# Patient Record
Sex: Male | Born: 1935 | Race: White | Hispanic: No | Marital: Married | Smoking: Never smoker
Health system: Southern US, Community
[De-identification: ages and names within clinical notes are randomized; demographics above are authoritative.]

## PROBLEM LIST (undated history)

## (undated) DIAGNOSIS — B019 Varicella without complication: Secondary | ICD-10-CM

## (undated) DIAGNOSIS — E785 Hyperlipidemia, unspecified: Secondary | ICD-10-CM

## (undated) DIAGNOSIS — E119 Type 2 diabetes mellitus without complications: Secondary | ICD-10-CM

## (undated) DIAGNOSIS — I10 Essential (primary) hypertension: Secondary | ICD-10-CM

## (undated) DIAGNOSIS — K259 Gastric ulcer, unspecified as acute or chronic, without hemorrhage or perforation: Secondary | ICD-10-CM

## (undated) DIAGNOSIS — K5792 Diverticulitis of intestine, part unspecified, without perforation or abscess without bleeding: Secondary | ICD-10-CM

## (undated) HISTORY — DX: Essential (primary) hypertension: I10

## (undated) HISTORY — DX: Type 2 diabetes mellitus without complications: E11.9

## (undated) HISTORY — DX: Varicella without complication: B01.9

## (undated) HISTORY — PX: REPAIR OF PERFORATED ULCER: SHX6065

## (undated) HISTORY — DX: Gastric ulcer, unspecified as acute or chronic, without hemorrhage or perforation: K25.9

## (undated) HISTORY — PX: TONSILLECTOMY: SUR1361

## (undated) HISTORY — DX: Diverticulitis of intestine, part unspecified, without perforation or abscess without bleeding: K57.92

## (undated) HISTORY — DX: Hyperlipidemia, unspecified: E78.5

---

## 2016-12-13 ENCOUNTER — Telehealth: Payer: Self-pay | Admitting: Hematology

## 2016-12-13 NOTE — Telephone Encounter (Signed)
Spoke to the pt's daughter to schedule an appt. Pt is coming from [place]. Records are under the media tab. Aware to have her father here 30 minutes early.

## 2016-12-21 NOTE — Progress Notes (Signed)
[place]  [phone] Fax:([phone]) [place] Note   Patient Care Team: Patient, No Pcp Per as PCP - General (General Practice) 12/22/2016  CHIEF COMPLAINTS/PURPOSE OF CONSULTATION:  Pancreatic Cancer  Oncology History   Cancer Staging Pancreatic cancer [facility]) Staging form: Exocrine Pancreas, AJCC 8th Edition - Clinical stage from 11/24/2016: Stage IIB (cT2, cN1, cM0) - Signed by Truitt Merle, MD on 12/23/2016       Pancreatic cancer ([place])   11/18/2016 Imaging    CT AP WO Contrast 11/18/16 IMPRESSION 1 Soft tissue masslike density in the region of the uncinate process and third portion of the duodenum. It it loss of fat planes between the soft tissue density, third portion duodenum, superior mesenteric vein and superior mesenteric artery. No downstream pancreatic duct dilation present. Low density lymph nodes measuring 15mm present in the gastrohepatic ligament.  2. Prostate gland enlargement with indentation the bladder base by the prostate.  3. Wall thickening of the stomach may represent changes of partial distention, hypertrophic gastritis or infiltrative disorders of the stomach wall.  4. Nonobstructing upper pole right renal calcification. Nonobstructing inferior pole left renal calcification.  5. Small pericardial effusion.       11/24/2016 Initial Biopsy    Cytopathology 11/24/16 INTERPRETATION A. Pancreas, HEAD, Mass, FNA With Cell Block: Mucinous neoplasm. Adenocarcinoma with mucinous differentiation cannot be excluded      11/24/2016 Initial Diagnosis    Pancreatic cancer ([place])      11/25/2016 Imaging    MRI Abdomen W WO Contrast MRCP 11/25/16  IMPRESSION: 1. Mass in the pancreatic head/uncinate process measuring 3.8 cm suspicious for an adenocarcinoma and causing upstream dilatation of the pancreatic duct and atrophy of the remaining pancreas.  2. The mass does appear to contact the posterior margin aspect of the  superior mesenteric artery and causes either marked narrowing or venous occlusion at the confluence to the portal vein and superior mesenteric vein.  3. No definite evidence of metastatic disease noting motion artifact mildly limits assessment.      12/03/2016 Tumor Marker    Baseline   CA19-9:  87  CEA:  10.6      12/03/2016 -  Chemotherapy    Neoadjuvant Chemotherapy Gemcitabine/Abraxane 3 weeks on and 1 weeks off. Will change to 2 weeks and 1 week off starting wth cycle 2.         HISTORY OF PRESENTING ILLNESS: 12/22/16  Cody Mckay 83 y.o. male is here because of recently diagnosed pancreatic cancer. Pt and his wife relocated from [place] to here 2 days ago, to live with their daughter. He was referred by his oncologist Dr. Vedia Coffer in [place]. He presents to the clinic today accompanied by his wife, daughter and granddaughter.   In the past he was diagnosed with an enlarged prostate, diverticulosis, HTN, and DM. He had a stomach ulcer that was cauterized in 2008. His father had lung cancer from smoking. His son had testicular cancer.   He has long-standing history of diabetes, initially presented with worsening hyperglycemia about 3-4 months ago.  He also reports vague abdominal discomfort, low appetite and weight loss.  He was seen by his endocrinologist, further studies with CT, endoscopy and biopsy showed it was his cancer causing elevated blood glucose. He had trouble eating, low energy and weight loss from 216lb to 150lbs over 3 months as of today. 6 months ago he was much more independent and active. 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 Since diagnosis he can do 50% of taking care of himself with no help. Had port placed on 12/14/16. He stated Gemcitabine and Abraxane 3 weeks on and 1 weeks off starting 12/03/16 and has completed 1 cycle so far.   He started treatment in [place] and moved down to [place] to be closer to his family 2 days  ago. Since starting treatment he has experienced episodes of diarrhea, constipation, nausea, and remains with low appetite and low energy. He takes Glucerna twice daily. The chocolate flavor exacerbated his diarrhea so he has strawberry flavor now. His sugar has been in 110-120s. He takes about 60mg  of potassium a day. He last saw Dr. Lyndel Safe on 12/02/16. He takes Zofran that helps his nausea. He has compazine if needed. He notes to feeling cold lately and thinks it is anemia related. His family plans to find PCP and endocrinologist in town soon. They are willing to try [place].    CURRENT THERAPY: Gemcitabine and Abraxane 3 weeks on and 1 week off starting 12/03/16. Will change to 2 weeks on and 1 week off starting with cycle 2.    MEDICAL HISTORY:  Past Medical History:  Diagnosis Date  . Diabetes mellitus without complication ([place])   . Gastric ulcer   . Hypertension     SURGICAL HISTORY: History reviewed. No pertinent surgical history.  SOCIAL HISTORY: Social History   Socioeconomic History  . Marital status: Married    Spouse name: Not on file  . Number of children: Not on file  . Years of education: Not on file  . Highest education level: Not on file  Social Needs  . Financial resource strain: Not on file  . Food insecurity - worry: Not on file  . Food insecurity - inability: Not on file  . Transportation needs - medical: Not on file  . Transportation needs - non-medical: Not on file  Occupational History  . Not on file  Tobacco Use  . Smoking status: Never Smoker  . Smokeless tobacco: Never Used  Substance and Sexual Activity  . Alcohol use: No    Frequency: Never  . Drug use: No  . Sexual activity: Not on file  Other Topics Concern  . Not on file  Social History Narrative  . Not on file    FAMILY HISTORY: Family History  Problem Relation Age of Onset  . Cancer Father        lung cancer  . Cancer Son        testicular cancer     ALLERGIES:  is allergic to  codeine.  MEDICATIONS:  Current Outpatient Medications  Medication Sig Dispense Refill  . alfuzosin (UROXATRAL) 10 MG 24 hr tablet     . amLODipine (NORVASC) 5 MG tablet     . atorvastatin (LIPITOR) 10 MG tablet     . Calcium-Magnesium-Vitamin D (CALCIUM 500 PO) Take 1 tablet by mouth daily.    . diphenoxylate-atropine (LOMOTIL) 2.5-0.025 MG tablet     . esomeprazole (NEXIUM) 40 MG capsule Take 2 capsules by mouth daily.    . insulin aspart (NOVOLOG) 100 UNIT/ML injection Inject 5 Units into the skin 3 (three) times daily before meals.    Marland Kitchen LANTUS SOLOSTAR 100 UNIT/ML Solostar Pen Inject 15 Units into the skin daily.    Marland Kitchen NIASPAN 750 MG CR tablet     . ondansetron (ZOFRAN) 8 MG tablet     . potassium chloride SA (K-DUR,KLOR-CON) 20 MEQ tablet  Take 60 mEq by mouth daily.     . prochlorperazine (COMPAZINE) 10 MG tablet as needed.    . triamcinolone ointment (KENALOG) 0.1 % as needed.    . lidocaine-prilocaine (EMLA) cream Apply 1 application topically as needed. 30 g 2   No current facility-administered medications for this visit.     REVIEW OF SYSTEMS:   Constitutional: Denies fevers, chills or abnormal night sweats (+) low appetite (+) weight loss (+) fatigue  Eyes: Denies blurriness of vision, double vision or watery eyes Ears, nose, mouth, throat, and face: Denies mucositis or sore throat Respiratory: Denies cough, dyspnea or wheezes Cardiovascular: Denies palpitation, chest discomfort or lower extremity swelling Gastrointestinal:  Denies nausea, heartburn (+) diarrhea, constipation (+) nausea  Skin: Denies abnormal skin rashes Lymphatics: Denies new lymphadenopathy or easy bruising Neurological:Denies numbness, tingling or new weaknesses Behavioral/Psych: Mood is stable, no new changes  All other systems were reviewed with the patient and are negative.  PHYSICAL EXAMINATION: ECOG PERFORMANCE STATUS: 3 - Symptomatic, >50% confined to bed  Vitals:   12/22/16 1431  BP: 115/65   Pulse: 76  Resp: 18  SpO2: 100%   Filed Weights   12/22/16 1431  Weight: 150 lb 6.4 oz (68.2 kg)    GENERAL:alert, no distress and comfortable, elderly Caucasian male sitting in wheelchair SKIN: skin color, texture, turgor are normal, no rashes or significant lesions EYES: normal, conjunctiva are pink and non-injected, sclera clear OROPHARYNX:no exudate, no erythema and lips, buccal mucosa, and tongue normal  NECK: supple, thyroid normal size, non-tender, without nodularity LYMPH:  no palpable lymphadenopathy in the cervical, axillary or inguinal LUNGS: clear to auscultation and percussion with normal breathing effort HEART: regular rate & rhythm and no murmurs and no lower extremity edema ABDOMEN:abdomen soft, non-tender and normal bowel sounds, no organomegaly  Musculoskeletal:no cyanosis of digits and no clubbing  PSYCH: alert & oriented x 3 with fluent speech NEURO: no focal motor/sensory deficits  LABORATORY DATA:  I have reviewed the data as listed CBC Latest Ref Rng & Units 12/22/2016  WBC 4.0 - 10.3 10e3/uL 3.0(L)  Hemoglobin 13.0 - 17.1 g/dL 12.4(L)  Hematocrit 38.4 - 49.9 % 37.4(L)  Platelets 140 - 400 10e3/uL 109(L)    CMP Latest Ref Rng & Units 12/22/2016  Glucose 70 - 140 mg/dl 199(H)  BUN 7.0 - 26.0 mg/dL 11.8  Creatinine 0.7 - 1.3 mg/dL 0.8  Sodium 136 - 145 mEq/L 138  Potassium 3.5 - 5.1 mEq/L 3.3(L)  CO2 22 - 29 mEq/L 26  Calcium 8.4 - 10.4 mg/dL 9.1  Total Protein 6.4 - 8.3 g/dL 6.0(L)  Total Bilirubin 0.20 - 1.20 mg/dL 0.62  Alkaline Phos 40 - 150 U/L 91  AST 5 - 34 U/L 27  ALT 0 - 55 U/L 37     CA19-9 12/03/16: 87  CEA  12/03/16: 10.6   PATHOLOGY  Cytopathology 11/24/16 INTERPRETATION A. Pancreas, HEAD, Mass, FNA With Cell Block: Mucinous neoplasm. Adenocarcinoma with mucinous differentiation cannot be excluded     RADIOGRAPHIC STUDIES: I have personally reviewed the radiological images as listed and agreed with the findings in the  report. No results found.  MRI Abdomen W WO Contrast MRCP 11/25/16  IMPRESSION: 1. Mass in the pancreatic head/uncinate process measuring 3.8 cm suspicious for an adenocarcinoma and causing upstream dilatation of the pancreatic duct and atrophy of the remaining pancreas.  2. The mass does appear to contact the posterior margin aspect of the superior mesenteric artery and causes either marked narrowing or  venous occlusion at the confluence to the portal vein and superior mesenteric vein.  3. No definite evidence of metastatic disease noting motion artifact mildly limits assessment.    CT AP WO Contrast 11/18/16 IMPRESSION 1 Soft tissue masslike density in the region of the uncinate process and third portion of the duodenum. It is loss of fat planes between the soft tissue density, third portion duodenum, superior mesenteric vein and superior mesenteric artery. No downstream pancreatic duct dilation present. Low density lymph nodes measuring 21mm present in the gastrohepatic ligament.  2. Prostate gland enlargement with indentation the bladder base by the prostate.  3. Wall thickening of the stomach may represent changes of partial distention, hypertrophic gastritis or infiltrative disorders of the stomach wall.  4. Nonobstructing upper pole right renal calcification. Nonobstructing inferior pole left renal calcification.  5. Small pericardial effusion.    ASSESSMENT & PLAN:  Cody Mckay is a 83 y.o. male with a history of abnormal EKG, right bundle branch block, bleeding gastric ulcer, Diverticulosis, DM, and HTN.    1. 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  However, his tumor is likely unresectable, or at least a borderline resectable, given the SMA invasion. Given his age, poor performance status and nutritional status, he is a not a candidate for surgery also.   -I will discuss his case in our GI tumor board, to get a surgical opinion. -If it is not possible to resect tumor, the goal of care is to control the disease.  -He has started chemo Gemcitabine and Abraxane (3 weeks on and 1 weeks off) on 12/03/16 with dose reduction. He has moderately tolerated his first cycle well. I will adjust his treatment to 2 weeks on and 1 week off for better tolerance. If he can tolerate, we will continue.  Given his advanced age, if he has a tolerance issue, I will change to single agent gemcitabine. -He had a port placed on 12/14/16, I will call in EMLA cream for him to take before treatments. Will get Chest Xray of port today.  -Baseline CEA 10.6 and Ca19.9 87 -Family would like advanced home care and PT to help. I will send referral.  -I will refer him to [facility].  -F/u in 2 weeks with Lacie or me with cycle 2 of chemo. I will confirm his first cycle chemo dose with Dr. Lyndel Safe.  Given his advanced age and performance status, I would likely use reduced dose gemcitabine and Abraxane. -I strongly encouraged him to participate in chemo class here before cycle 2 chemo  2. DM, HTN -I will refer him to [facility] endocrinology -continue insulin and meds  -We discussed the impact of chemotherapy on his glucose and blood pressure, will monitor closely.  3.  Weight loss and malnutrition -I recommended dietitian consult, he declined for now  -I encouraged him to increase nutrition supplement Glucerna  4.  Fatigue and deconditioning -I will refer him to advanced home care for home PT and OT -Encouraged him to be physically active  5.  Hypokalemia -We will increase potassium to 80 Meq daily for 7 days then back to 66meq daily   6. Goal of care discussion    -We discussed the incurable nature of his cancer  if surgery is not an option, and the overall poor prognosis, especially if he does not have good response to chemotherapy or progress on chemo -The patient understands the goal of care is palliative. -I recommend DNR/DNI, he will think about it   PLAN:  -Labs today  -Prescribed EMLA cream today  -[facility], PT and [facility] referrals  -Chest Xray of Port today  Lab, flush, f/u with me or Lacie and chemo gemcitabine and Abraxane on 12/7 and 12/14   Orders Placed This Encounter  Procedures  . DG Chest 1 View    Standing Status:   Future    Number of Occurrences:   1    Standing Expiration Date:   12/22/2017    Order Specific Question:   Reason for Exam (SYMPTOM  OR DIAGNOSIS REQUIRED)    Answer:   confirm port position    Order Specific Question:   Preferred imaging location?    Answer:   [facility]    Order Specific Question:   Radiology Contrast Protocol - do NOT remove file path    Answer:   [email or web address]  . CBC with Differential    Standing Status:   Standing    Number of Occurrences:   100    Standing Expiration Date:   12/22/2021  . Comprehensive metabolic panel    Standing Status:   Standing    Number of Occurrences:   100    Standing Expiration Date:   12/22/2021  . Ferritin    Standing Status:   Future    Number of Occurrences:   1    Standing Expiration Date:   12/22/2017  . Iron and TIBC    Standing Status:   Future    Number of Occurrences:   1    Standing Expiration Date:   12/22/2017  . CA 19.9    Standing Status:   Standing    Number of Occurrences:   100    Standing Expiration Date:   12/22/2021    All questions were answered. The patient knows to call the clinic with any problems, questions or concerns. I spent 45 minutes counseling the patient face to face. The total time spent in the appointment was 60 minutes and more  than 50% was on counseling.     Truitt Merle, MD 12/22/2016    This document serves as a record of services personally performed by Truitt Merle, MD. It was created on her behalf by Joslyn Devon, a trained medical scribe. The creation of this record is based on the scribe's personal observations and the provider's statements to them.    I have reviewed the above documentation for accuracy and completeness, and I agree with the above.

## 2016-12-22 ENCOUNTER — Ambulatory Visit (HOSPITAL_COMMUNITY)
Admission: RE | Admit: 2016-12-22 | Discharge: 2016-12-22 | Disposition: A | Discharge status: Home / Self Care | Payer: Medicare Other | Source: Ambulatory Visit | Attending: Hematology | Admitting: Hematology

## 2016-12-22 ENCOUNTER — Telehealth: Payer: Self-pay | Admitting: Hematology

## 2016-12-22 ENCOUNTER — Ambulatory Visit (HOSPITAL_BASED_OUTPATIENT_CLINIC_OR_DEPARTMENT_OTHER): Payer: Medicare Other | Admitting: Hematology

## 2016-12-22 ENCOUNTER — Telehealth: Payer: Self-pay | Admitting: *Deleted

## 2016-12-22 ENCOUNTER — Encounter: Payer: Self-pay | Admitting: Hematology

## 2016-12-22 ENCOUNTER — Ambulatory Visit (HOSPITAL_BASED_OUTPATIENT_CLINIC_OR_DEPARTMENT_OTHER): Payer: Medicare Other

## 2016-12-22 DIAGNOSIS — E119 Type 2 diabetes mellitus without complications: Secondary | ICD-10-CM

## 2016-12-22 DIAGNOSIS — C250 Malignant neoplasm of head of pancreas: Secondary | ICD-10-CM | POA: Diagnosis not present

## 2016-12-22 DIAGNOSIS — Z7189 Other specified counseling: Secondary | ICD-10-CM

## 2016-12-22 DIAGNOSIS — E876 Hypokalemia: Secondary | ICD-10-CM | POA: Diagnosis not present

## 2016-12-22 DIAGNOSIS — I10 Essential (primary) hypertension: Secondary | ICD-10-CM | POA: Diagnosis not present

## 2016-12-22 DIAGNOSIS — R5383 Other fatigue: Secondary | ICD-10-CM | POA: Diagnosis not present

## 2016-12-22 DIAGNOSIS — Z801 Family history of malignant neoplasm of trachea, bronchus and lung: Secondary | ICD-10-CM | POA: Diagnosis not present

## 2016-12-22 DIAGNOSIS — R634 Abnormal weight loss: Secondary | ICD-10-CM

## 2016-12-22 DIAGNOSIS — E46 Unspecified protein-calorie malnutrition: Secondary | ICD-10-CM | POA: Diagnosis not present

## 2016-12-22 DIAGNOSIS — Z808 Family history of malignant neoplasm of other organs or systems: Secondary | ICD-10-CM | POA: Diagnosis not present

## 2016-12-22 DIAGNOSIS — C259 Malignant neoplasm of pancreas, unspecified: Secondary | ICD-10-CM | POA: Insufficient documentation

## 2016-12-22 LAB — COMPREHENSIVE METABOLIC PANEL
ALBUMIN: 3 g/dL — AB (ref 3.5–5.0)
ALK PHOS: 91 U/L (ref 40–150)
ALT: 37 U/L (ref 0–55)
ANION GAP: 8 meq/L (ref 3–11)
AST: 27 U/L (ref 5–34)
BILIRUBIN TOTAL: 0.62 mg/dL (ref 0.20–1.20)
BUN: 11.8 mg/dL (ref 7.0–26.0)
CALCIUM: 9.1 mg/dL (ref 8.4–10.4)
CO2: 26 meq/L (ref 22–29)
Chloride: 104 mEq/L (ref 98–109)
Creatinine: 0.8 mg/dL (ref 0.7–1.3)
Glucose: 199 mg/dl — ABNORMAL HIGH (ref 70–140)
Potassium: 3.3 mEq/L — ABNORMAL LOW (ref 3.5–5.1)
Sodium: 138 mEq/L (ref 136–145)
TOTAL PROTEIN: 6 g/dL — AB (ref 6.4–8.3)

## 2016-12-22 LAB — CBC WITH DIFFERENTIAL/PLATELET
BASO%: 0.7 % (ref 0.0–2.0)
Basophils Absolute: 0 10*3/uL (ref 0.0–0.1)
EOS%: 0.7 % (ref 0.0–7.0)
Eosinophils Absolute: 0 10*3/uL (ref 0.0–0.5)
HEMATOCRIT: 37.4 % — AB (ref 38.4–49.9)
HEMOGLOBIN: 12.4 g/dL — AB (ref 13.0–17.1)
LYMPH#: 1.2 10*3/uL (ref 0.9–3.3)
LYMPH%: 39.5 % (ref 14.0–49.0)
MCH: 29.5 pg (ref 27.2–33.4)
MCHC: 33.1 g/dL (ref 32.0–36.0)
MCV: 89.3 fL (ref 79.3–98.0)
MONO#: 0.1 10*3/uL (ref 0.1–0.9)
MONO%: 2.9 % (ref 0.0–14.0)
NEUT#: 1.7 10*3/uL (ref 1.5–6.5)
NEUT%: 56.2 % (ref 39.0–75.0)
Platelets: 109 10*3/uL — ABNORMAL LOW (ref 140–400)
RBC: 4.19 10*6/uL — ABNORMAL LOW (ref 4.20–5.82)
RDW: 13.7 % (ref 11.0–14.6)
WBC: 3 10*3/uL — AB (ref 4.0–10.3)

## 2016-12-22 LAB — TECHNOLOGIST REVIEW

## 2016-12-22 MED ORDER — LIDOCAINE-PRILOCAINE 2.5-2.5 % EX CREA: 1.0000 "application " | TOPICAL_CREAM | CUTANEOUS | 2 refills | Status: AC | PRN

## 2016-12-22 NOTE — Telephone Encounter (Signed)
Scheduled appt per 11/21 los - Gave patient AVS and calender per los.  

## 2016-12-22 NOTE — Progress Notes (Signed)
  Oncology Nurse Navigator Documentation  Navigator Location: [facility] (12/22/16 1516) Referral date to RadOnc/MedOnc: 12/10/16 (12/22/16 1516) )Navigator Encounter Type: Initial MedOnc (12/22/16 1516)   Abnormal Finding Date: 11/18/16 (12/22/16 1516) Confirmed Diagnosis Date: 11/24/16 (12/22/16 1516)                 Treatment Phase: Other(transfer of care from [place]) (12/22/16 1516) Barriers/Navigation Needs: No Questions;No Needs (12/22/16 1516)   Interventions: Psycho-social support (12/22/16 1516)  Met with patient and his wife, daughter and granddaughter.  I introduced my role as GI navigator and provided patient with my contact information. I also provided patient with written information from [facility]. Patient verbalized understanding that he can call with questions or concerns.          Acuity: Level 1 (12/22/16 1516) Acuity Level 1: Initial guidance, education and coordination as needed (12/22/16 1516)       Time Spent with Patient: 30 (12/22/16 1516)

## 2016-12-22 NOTE — Telephone Encounter (Signed)
Dr. Burr Medico notified of K+  3.3 today.  Spoke with daughter Blair Promise, and instructed her to have pt take Kdur  80 meq daily for 1 week, then go back to 60 meq daily as per Dr. Ernestina Penna instructions.  Sheryl voiced understanding.

## 2016-12-23 ENCOUNTER — Encounter: Payer: Self-pay | Admitting: Hematology

## 2016-12-23 DIAGNOSIS — Z7189 Other specified counseling: Secondary | ICD-10-CM | POA: Insufficient documentation

## 2016-12-23 LAB — CANCER ANTIGEN 19-9: CA 19-9: 36 U/mL — ABNORMAL HIGH (ref 0–35)

## 2016-12-23 NOTE — Progress Notes (Signed)
START ON PATHWAY REGIMEN - Pancreatic     A cycle is every 28 days:     Nab-paclitaxel (protein bound)      Gemcitabine   **Always confirm dose/schedule in your pharmacy ordering system**    Patient Characteristics: Adenocarcinoma, Locally Advanced, Anatomically Unresectable, First Line, PS = 0, 1 Histology: Adenocarcinoma Current evidence of distant metastases<= No AJCC T Category: T2 AJCC N Category: N1 AJCC M Category: M0 AJCC 8 Stage Grouping: IIB Line of Therapy: First Line Would you be surprised if this patient died  in the next year<= I would NOT be surprised if this patient died in the next year Intent of Therapy: Non-Curative / Palliative Intent, Discussed with Patient

## 2016-12-24 ENCOUNTER — Other Ambulatory Visit: Payer: Self-pay | Admitting: *Deleted

## 2016-12-24 DIAGNOSIS — C251 Malignant neoplasm of body of pancreas: Secondary | ICD-10-CM

## 2016-12-24 LAB — IRON AND TIBC
%SAT: 14 % — AB (ref 20–55)
IRON: 24 ug/dL — AB (ref 42–163)
TIBC: 166 ug/dL — ABNORMAL LOW (ref 202–409)
UIBC: 143 ug/dL (ref 117–376)

## 2016-12-24 LAB — FERRITIN: FERRITIN: 570 ng/mL — AB (ref 22–316)

## 2016-12-28 ENCOUNTER — Other Ambulatory Visit: Payer: Medicare Other

## 2016-12-28 ENCOUNTER — Encounter: Payer: Self-pay | Admitting: *Deleted

## 2016-12-30 ENCOUNTER — Telehealth: Payer: Self-pay | Admitting: *Deleted

## 2016-12-30 NOTE — Telephone Encounter (Signed)
Cody Mckay with [facility] left a message requesting a verbal order for admission to [facility].  RN left her a message that Dr Burr Medico has ordered the [facility] referral. To call back if has questions

## 2017-01-03 ENCOUNTER — Ambulatory Visit (HOSPITAL_BASED_OUTPATIENT_CLINIC_OR_DEPARTMENT_OTHER): Payer: Medicare Other | Admitting: Medical

## 2017-01-03 ENCOUNTER — Ambulatory Visit (HOSPITAL_BASED_OUTPATIENT_CLINIC_OR_DEPARTMENT_OTHER): Payer: Medicare Other

## 2017-01-03 ENCOUNTER — Other Ambulatory Visit: Payer: Self-pay | Admitting: *Deleted

## 2017-01-03 ENCOUNTER — Ambulatory Visit (HOSPITAL_COMMUNITY)
Admission: RE | Admit: 2017-01-03 | Discharge: 2017-01-03 | Disposition: A | Discharge status: Home / Self Care | Payer: Medicare Other | Source: Ambulatory Visit | Attending: Medical | Admitting: Medical

## 2017-01-03 ENCOUNTER — Other Ambulatory Visit: Payer: Self-pay | Admitting: Medical

## 2017-01-03 ENCOUNTER — Telehealth: Payer: Self-pay | Admitting: *Deleted

## 2017-01-03 VITALS — BP 108/65 | HR 65 | Temp 97.8°F | Resp 18 | Ht 70.5 in | Wt 141.9 lb

## 2017-01-03 DIAGNOSIS — R197 Diarrhea, unspecified: Secondary | ICD-10-CM | POA: Diagnosis not present

## 2017-01-03 DIAGNOSIS — C250 Malignant neoplasm of head of pancreas: Secondary | ICD-10-CM

## 2017-01-03 DIAGNOSIS — R112 Nausea with vomiting, unspecified: Secondary | ICD-10-CM

## 2017-01-03 DIAGNOSIS — Z9181 History of falling: Secondary | ICD-10-CM

## 2017-01-03 DIAGNOSIS — E860 Dehydration: Secondary | ICD-10-CM

## 2017-01-03 DIAGNOSIS — R109 Unspecified abdominal pain: Secondary | ICD-10-CM

## 2017-01-03 DIAGNOSIS — D72823 Leukemoid reaction: Secondary | ICD-10-CM

## 2017-01-03 DIAGNOSIS — E876 Hypokalemia: Secondary | ICD-10-CM

## 2017-01-03 DIAGNOSIS — R531 Weakness: Secondary | ICD-10-CM | POA: Diagnosis not present

## 2017-01-03 DIAGNOSIS — K315 Obstruction of duodenum: Secondary | ICD-10-CM | POA: Diagnosis not present

## 2017-01-03 DIAGNOSIS — R001 Bradycardia, unspecified: Secondary | ICD-10-CM | POA: Diagnosis not present

## 2017-01-03 LAB — URINALYSIS, MICROSCOPIC - CHCC
Bilirubin (Urine): NEGATIVE
Blood: NEGATIVE
Glucose: NEGATIVE mg/dL
KETONES: 5 mg/dL
LEUKOCYTE ESTERASE: NEGATIVE
Nitrite: NEGATIVE
RBC / HPF: NEGATIVE (ref 0–2)
SPECIFIC GRAVITY, URINE: 1.02 (ref 1.003–1.035)
Urobilinogen, UR: 0.2 mg/dL (ref 0.2–1)
pH: 6 (ref 4.6–8.0)

## 2017-01-03 LAB — COMPREHENSIVE METABOLIC PANEL
ALBUMIN: 2.8 g/dL — AB (ref 3.5–5.0)
ALK PHOS: 73 U/L (ref 40–150)
ALT: 14 U/L (ref 0–55)
AST: 15 U/L (ref 5–34)
Anion Gap: 10 mEq/L (ref 3–11)
BUN: 25 mg/dL (ref 7.0–26.0)
CO2: 25 mEq/L (ref 22–29)
Calcium: 9.2 mg/dL (ref 8.4–10.4)
Chloride: 103 mEq/L (ref 98–109)
Creatinine: 1 mg/dL (ref 0.7–1.3)
GLUCOSE: 135 mg/dL (ref 70–140)
POTASSIUM: 2.9 meq/L — AB (ref 3.5–5.1)
SODIUM: 138 meq/L (ref 136–145)
Total Bilirubin: 1.52 mg/dL — ABNORMAL HIGH (ref 0.20–1.20)
Total Protein: 6 g/dL — ABNORMAL LOW (ref 6.4–8.3)

## 2017-01-03 LAB — CBC WITH DIFFERENTIAL/PLATELET
BASO%: 0.3 % (ref 0.0–2.0)
Basophils Absolute: 0 10*3/uL (ref 0.0–0.1)
EOS%: 0 % (ref 0.0–7.0)
Eosinophils Absolute: 0 10*3/uL (ref 0.0–0.5)
HEMATOCRIT: 34.6 % — AB (ref 38.4–49.9)
HEMOGLOBIN: 11.5 g/dL — AB (ref 13.0–17.1)
LYMPH#: 1.5 10*3/uL (ref 0.9–3.3)
LYMPH%: 9.5 % — ABNORMAL LOW (ref 14.0–49.0)
MCH: 29.7 pg (ref 27.2–33.4)
MCHC: 33.1 g/dL (ref 32.0–36.0)
MCV: 89.9 fL (ref 79.3–98.0)
MONO#: 1.8 10*3/uL — AB (ref 0.1–0.9)
MONO%: 11.6 % (ref 0.0–14.0)
NEUT%: 78.6 % — AB (ref 39.0–75.0)
NEUTROS ABS: 12.5 10*3/uL — AB (ref 1.5–6.5)
PLATELETS: 326 10*3/uL (ref 140–400)
RBC: 3.85 10*6/uL — ABNORMAL LOW (ref 4.20–5.82)
RDW: 15.2 % — ABNORMAL HIGH (ref 11.0–14.6)
WBC: 15.9 10*3/uL — ABNORMAL HIGH (ref 4.0–10.3)

## 2017-01-03 MED ORDER — SODIUM CHLORIDE 0.9% FLUSH
10.0000 mL | Freq: Once | INTRAVENOUS | Status: AC
  Administered 2017-01-03: 10 mL via INTRAVENOUS
  Filled 2017-01-03: qty 10

## 2017-01-03 MED ORDER — ONDANSETRON HCL 4 MG/2ML IJ SOLN
8.0000 mg | Freq: Once | INTRAMUSCULAR | Status: AC
  Administered 2017-01-03: 8 mg via INTRAVENOUS

## 2017-01-03 MED ORDER — ONDANSETRON HCL 4 MG/2ML IJ SOLN
INTRAMUSCULAR | Status: AC
  Filled 2017-01-03: qty 4

## 2017-01-03 MED ORDER — SODIUM CHLORIDE 0.9 % IV SOLN: Freq: Once | INTRAVENOUS | Status: DC

## 2017-01-03 MED ORDER — HEPARIN SOD (PORK) LOCK FLUSH 100 UNIT/ML IV SOLN
500.0000 [IU] | Freq: Once | INTRAVENOUS | Status: AC
  Administered 2017-01-03: 500 [IU] via INTRAVENOUS
  Filled 2017-01-03: qty 5

## 2017-01-03 MED ORDER — SODIUM CHLORIDE 0.9 % IV SOLN
Freq: Once | INTRAVENOUS | Status: AC
  Administered 2017-01-03: 12:00:00 via INTRAVENOUS
  Filled 2017-01-03: qty 1000

## 2017-01-03 MED ORDER — SODIUM CHLORIDE 0.9 % IV SOLN
Freq: Once | INTRAVENOUS | Status: AC
  Administered 2017-01-03: 11:00:00 via INTRAVENOUS

## 2017-01-03 NOTE — Telephone Encounter (Signed)
Received call from Cody Mckay stating that pt has not been drinking well & vomited 7 times since Friday-none since Sun AM, weak, fell in bathtub early this am & thinks he is dehydrated.  Informed to come in to see Symptom Management this am.  Scheduling message sent.

## 2017-01-03 NOTE — Progress Notes (Signed)
Urine specimen obtained for u/a and culture  Transported via w/c to Radiology for chest and abdominal films per NT. Radiology brought pt back to [facility]  Pt c/o fatigue and that nausea was somewhat better.

## 2017-01-03 NOTE — Patient Instructions (Signed)
Dehydration, Adult Dehydration is a condition in which there is not enough fluid or water in the body. This happens when you lose more fluids than you take in. Important organs, such as the kidneys, brain, and heart, cannot function without a proper amount of fluids. Any loss of fluids from the body can lead to dehydration. Dehydration can range from mild to severe. This condition should be treated right away to prevent it from becoming severe. What are the causes? This condition may be caused by:  Vomiting.  Diarrhea.  Excessive sweating, such as from heat exposure or exercise.  Not drinking enough fluid, especially: ? When ill. ? While doing activity that requires a lot of energy.  Excessive urination.  Fever.  Infection.  Certain medicines, such as medicines that cause the body to lose excess fluid (diuretics).  Inability to access safe drinking water.  Reduced physical ability to get adequate water and food.  What increases the risk? This condition is more likely to develop in people:  Who have a poorly controlled long-term (chronic) illness, such as diabetes, heart disease, or kidney disease.  Who are age 65 or older.  Who are disabled.  Who live in a place with high altitude.  Who play endurance sports.  What are the signs or symptoms? Symptoms of mild dehydration may include:  Thirst.  Dry lips.  Slightly dry mouth.  Dry, warm skin.  Dizziness. Symptoms of moderate dehydration may include:  Very dry mouth.  Muscle cramps.  Dark urine. Urine may be the color of tea.  Decreased urine production.  Decreased tear production.  Heartbeat that is irregular or faster than normal (palpitations).  Headache.  Light-headedness, especially when you stand up from a sitting position.  Fainting (syncope). Symptoms of severe dehydration may include:  Changes in skin, such as: ? Cold and clammy skin. ? Blotchy (mottled) or pale skin. ? Skin that does  not quickly return to normal after being lightly pinched and released (poor skin turgor).  Changes in body fluids, such as: ? Extreme thirst. ? No tear production. ? Inability to sweat when body temperature is high, such as in hot weather. ? Very little urine production.  Changes in vital signs, such as: ? Weak pulse. ? Pulse that is more than 100 beats a minute when sitting still. ? Rapid breathing. ? Low blood pressure.  Other changes, such as: ? Sunken eyes. ? Cold hands and feet. ? Confusion. ? Lack of energy (lethargy). ? Difficulty waking up from sleep. ? Short-term weight loss. ? Unconsciousness. How is this diagnosed? This condition is diagnosed based on your symptoms and a physical exam. Blood and urine tests may be done to help confirm the diagnosis. How is this treated? Treatment for this condition depends on the severity. Mild or moderate dehydration can often be treated at home. Treatment should be started right away. Do not wait until dehydration becomes severe. Severe dehydration is an emergency and it needs to be treated in a hospital. Treatment for mild dehydration may include:  Drinking more fluids.  Replacing salts and minerals in your blood (electrolytes) that you may have lost. Treatment for moderate dehydration may include:  Drinking an oral rehydration solution (ORS). This is a drink that helps you replace fluids and electrolytes (rehydrate). It can be found at pharmacies and retail stores. Treatment for severe dehydration may include:  Receiving fluids through an IV tube.  Receiving an electrolyte solution through a feeding tube that is passed through your nose   and into your stomach (nasogastric tube, or NG tube).  Correcting any abnormalities in electrolytes.  Treating the underlying cause of dehydration. Follow these instructions at home:  If directed by your health care provider, drink an ORS: ? Make an ORS by following instructions on the  package. ? Start by drinking small amounts, about  cup (120 mL) every 5-10 minutes. ? Slowly increase how much you drink until you have taken the amount recommended by your health care provider.  Drink enough clear fluid to keep your urine clear or pale yellow. If you were told to drink an ORS, finish the ORS first, then start slowly drinking other clear fluids. Drink fluids such as: ? Water. Do not drink only water. Doing that can lead to having too little salt (sodium) in the body (hyponatremia). ? Ice chips. ? Fruit juice that you have added water to (diluted fruit juice). ? Low-calorie sports drinks.  Avoid: ? Alcohol. ? Drinks that contain a lot of sugar. These include high-calorie sports drinks, fruit juice that is not diluted, and soda. ? Caffeine. ? Foods that are greasy or contain a lot of fat or sugar.  Take over-the-counter and prescription medicines only as told by your health care provider.  Do not take sodium tablets. This can lead to having too much sodium in the body (hypernatremia).  Eat foods that contain a healthy balance of electrolytes, such as bananas, oranges, potatoes, tomatoes, and spinach.  Keep all follow-up visits as told by your health care provider. This is important. Contact a health care provider if:  You have abdominal pain that: ? Gets worse. ? Stays in one area (localizes).  You have a rash.  You have a stiff neck.  You are more irritable than usual.  You are sleepier or more difficult to wake up than usual.  You feel weak or dizzy.  You feel very thirsty.  You have urinated only a small amount of very dark urine over 6-8 hours. Get help right away if:  You have symptoms of severe dehydration.  You cannot drink fluids without vomiting.  Your symptoms get worse with treatment.  You have a fever.  You have a severe headache.  You have vomiting or diarrhea that: ? Gets worse. ? Does not go away.  You have blood or green matter  (bile) in your vomit.  You have blood in your stool. This may cause stool to look black and tarry.  You have not urinated in 6-8 hours.  You faint.  Your heart rate while sitting still is over 100 beats a minute.  You have trouble breathing. This information is not intended to replace advice given to you by your health care provider. Make sure you discuss any questions you have with your health care provider. Document Released: 01/18/2005 Document Revised: 08/15/2015 Document Reviewed: 03/14/2015 Elsevier Interactive Patient Education  2018 [employer]    Hypokalemia Hypokalemia means that the amount of potassium in the blood is lower than normal.Potassium is a chemical that helps regulate the amount of fluid in the body (electrolyte). It also stimulates muscle tightening (contraction) and helps nerves work properly.Normally, most of the body's potassium is inside of cells, and only a very small amount is in the blood. Because the amount in the blood is so small, minor changes to potassium levels in the blood can be life-threatening. What are the causes? This condition may be caused by:  Antibiotic medicine.  Diarrhea or vomiting. Taking too much of a medicine   that helps you have a bowel movement (laxative) can cause diarrhea and lead to hypokalemia.  Chronic kidney disease (CKD).  Medicines that help the body get rid of excess fluid (diuretics).  Eating disorders, such as bulimia.  Low magnesium levels in the body.  Sweating a lot.  What are the signs or symptoms? Symptoms of this condition include:  Weakness.  Constipation.  Fatigue.  Muscle cramps.  Mental confusion.  Skipped heartbeats or irregular heartbeat (palpitations).  Tingling or numbness.  How is this diagnosed? This condition is diagnosed with a blood test. How is this treated? Hypokalemia can be treated by taking potassium supplements by mouth or adjusting the medicines that you take.  Treatment may also include eating more foods that contain a lot of potassium. If your potassium level is very low, you may need to get potassium through an IV tube in one of your veins and be monitored in the hospital. Follow these instructions at home:  Take over-the-counter and prescription medicines only as told by your health care provider. This includes vitamins and supplements.  Eat a healthy diet. A healthy diet includes fresh fruits and vegetables, whole grains, healthy fats, and lean proteins.  If instructed, eat more foods that contain a lot of potassium, such as: ? Nuts, such as peanuts and pistachios. ? Seeds, such as sunflower seeds and pumpkin seeds. ? Peas, lentils, and lima beans. ? Whole grain and bran cereals and breads. ? Fresh fruits and vegetables, such as apricots, avocado, bananas, cantaloupe, kiwi, oranges, tomatoes, asparagus, and potatoes. ? Orange juice. ? Tomato juice. ? Red meats. ? Yogurt.  Keep all follow-up visits as told by your health care provider. This is important. Contact a health care provider if:  You have weakness that gets worse.  You feel your heart pounding or racing.  You vomit.  You have diarrhea.  You have diabetes (diabetes mellitus) and you have trouble keeping your blood sugar (glucose) in your target range. Get help right away if:  You have chest pain.  You have shortness of breath.  You have vomiting or diarrhea that lasts for more than 2 days.  You faint. This information is not intended to replace advice given to you by your health care provider. Make sure you discuss any questions you have with your health care provider. Document Released: 01/18/2005 Document Revised: 09/06/2015 Document Reviewed: 09/06/2015 Elsevier Interactive Patient Education  2018 [employer] Inc.  

## 2017-01-03 NOTE — Progress Notes (Signed)
[facility] Progress Note   Cody Mckay [id number] 06/01/1932 83 y.o.  Cody Mckay is managed by Dr. Truitt Merle   Actively treated with chemotherapy: Previously treated with chemotherapy in [place].  Last treated 2 weeks ago.  Plans are to treat the patient with Gemzar and Abraxane.  Last Treated: Approximately 2 weeks ago  Assessment: Plan:    Malignant neoplasm of head of pancreas ([name]) - Plan: 0.9 %  sodium chloride infusion, sodium chloride flush (NS) 0.9 % injection 10 mL, heparin lock flush 100 unit/mL, DG Abd 2 Views, DISCONTINUED: ondansetron (ZOFRAN) 8 mg in sodium chloride 0.9 % 50 mL IVPB  Dehydration - Plan: 0.9 %  sodium chloride infusion, sodium chloride flush (NS) 0.9 % injection 10 mL, heparin lock flush 100 unit/mL, DISCONTINUED: ondansetron (ZOFRAN) 8 mg in sodium chloride 0.9 % 50 mL IVPB  Nausea and vomiting, intractability of vomiting not specified, unspecified vomiting type - Plan: 0.9 %  sodium chloride infusion, sodium chloride flush (NS) 0.9 % injection 10 mL, heparin lock flush 100 unit/mL, DG Abd 2 Views, ondansetron (ZOFRAN) injection 8 mg, DISCONTINUED: ondansetron (ZOFRAN) 8 mg in sodium chloride 0.9 % 50 mL IVPB  Leukemoid reaction - Plan: DG Chest 2 View, Urinalysis with microscopic, DISCONTINUED: ondansetron (ZOFRAN) 8 mg in sodium chloride 0.9 % 50 mL IVPB  Hypokalemia - Plan: sodium chloride 0.9 % 1,000 mL with potassium chloride 20 mEq infusion, DISCONTINUED: ondansetron (ZOFRAN) 8 mg in sodium chloride 0.9 % 50 mL IVPB   Malignant neoplasm of the head of the pancreas: Pending chemotherapy with Dr. Burr Medico on 01/07/2017.  Dehydration: The patient was given 2 L of IV normal saline today.  Nausea and vomiting: Patient was given Zofran 8 mg IV today.  The patient's KUB returned showing no evidence of obstruction.  Leukemoid reaction: The patient's chest x-ray and urinalysis returned within normal limits.  Hypokalemia.  The patient  was given potassium chloride 20 mEq IV today.  He typically takes 80 mEq p.o. daily.  He did not take this this weekend.  He was instructed to restart his oral potassium.  Please see After Visit Summary for patient specific instructions.  Future Appointments  Date Time Provider [place]  01/04/2017 10:00 AM Dorothyann Peng, NP [facility]  01/07/2017 10:00 AM [facility] LAB 5 [facility] None  01/07/2017 10:15 AM [facility] B6 [facility] None  01/07/2017 10:45 AM Truitt Merle, MD [facility] None  01/07/2017 11:45 AM [facility] J32 DNS [facility] None  01/14/2017 10:45 AM [facility] LAB 3 [facility] None  01/14/2017 11:00 AM [facility] FLUSH NURSE 2 [facility] None  01/14/2017 11:30 AM Alla Feeling, NP [facility] None  01/14/2017 12:15 PM [facility] F20 [facility] None    Orders Placed This Encounter  Procedures  . DG Abd 2 Views  . DG Chest 2 View  . Urinalysis with microscopic       Subjective:   Patient ID:  Cody Mckay is a 83 y.o. (DOB 1932/04/04) male.  Chief Complaint:  Chief Complaint  Patient presents with  . Nausea    vomiting    HPI Cody Mckay is an 83 year old male with a recent diagnosis of a clinical stage IIb (cT2, cN1, cM0) pancreatic cancer.  He was last seen by Dr. Burr Medico for his initial appointment on 12/22/2016. He previously received 3 cycles of chemotherapy in [place].  His last cycle was approximately 2 and 1/2 weeks ago. Dr. Burr Medico discussed the use of neoadjuvant chemotherapy with gemcitabine and Abraxane given 3 weeks on  and one-week off with plans to switch to 2 weeks on and one-week off with cycle 2 of therapy.  The patient's family contacted our office this morning stating that the patient had nausea and vomiting this weekend and was dehydrated. He has been weak and lightheaded since Friday.  He reports having some abdominal pain.  He only required 2 Tylenol this morning however.  He has been having watery diarrhea.  He has not  been on antibiotics recently.  He fell last night at home but did not injure himself.  He reports that he slipped on the floor in the bathroom as he only had on socks.  He fell into the tub.  He believes that he may have blacked out.  He is not taking many of his medications except for his insulin.  His blood glucose this morning was 130.  He is concerned that he missed 1 treatment while he was transitioning to our office from [place].  He asked today if he can move up his chemo which is scheduled for this Friday.  He is concerned that all of his symptoms could be related to tumor enlargement.  Medications: I have reviewed the patient's current medications.  Allergies:  Allergies  Allergen Reactions  . Codeine Other (See Comments)    Schizophrenic reactions.    Past Medical History:  Diagnosis Date  . Diabetes mellitus without complication ([place])   . Gastric ulcer   . Hypertension     No past surgical history on file.  Family History  Problem Relation Age of Onset  . Cancer Father        lung cancer  . Cancer Son        testicular cancer     Social History   Socioeconomic History  . Marital status: Married    Spouse name: Not on file  . Number of children: Not on file  . Years of education: Not on file  . Highest education level: Not on file  Social Needs  . Financial resource strain: Not on file  . Food insecurity - worry: Not on file  . Food insecurity - inability: Not on file  . Transportation needs - medical: Not on file  . Transportation needs - non-medical: Not on file  Occupational History  . Not on file  Tobacco Use  . Smoking status: Never Smoker  . Smokeless tobacco: Never Used  Substance and Sexual Activity  . Alcohol use: No    Frequency: Never  . Drug use: No  . Sexual activity: Not on file  Other Topics Concern  . Not on file  Social History Narrative  . Not on file    Past Medical History, Surgical history, Social history, and Family  history were reviewed and updated as appropriate.   Please see review of systems for further details on the patient's review from today.   Review of Systems:  Review of Systems  Constitutional: Positive for appetite change, fatigue and unexpected weight change.  Gastrointestinal: Positive for diarrhea, nausea and vomiting.  Neurological: Positive for weakness and light-headedness.       Fall at home yesterday    Objective:   Physical Exam:  BP 108/65 (BP Location: Left Arm, Patient Position: Sitting)   Pulse 65   Temp 97.8 F (36.6 C) (Oral)   Resp 18   Ht 5' 10.5" (1.791 m)   Wt 141 lb 14.4 oz (64.4 kg)   SpO2 99%   BMI 20.07 kg/m  ECOG: 2  Physical Exam  Constitutional:  The patient is an elderly chronically ill-appearing male who is ambulating with the use of a wheelchair.  HENT:  Head: Normocephalic and atraumatic.  Mouth/Throat: No oropharyngeal exudate.  Mucous membranes are dry.  Neck: Normal range of motion. Neck supple.  Cardiovascular: Normal rate, regular rhythm and normal heart sounds. Exam reveals no gallop and no friction rub.  No murmur heard. Pulmonary/Chest: Effort normal and breath sounds normal. No respiratory distress. He has no wheezes. He has no rales.  Abdominal: Soft. Bowel sounds are normal. He exhibits no distension. There is no tenderness. There is no rebound and no guarding.  Musculoskeletal: He exhibits no edema.  Lymphadenopathy:    He has no cervical adenopathy.  Neurological: He is alert. Coordination (The patient is ambulating with the use of a wheelchair.) abnormal.  Skin: Skin is warm and dry. No rash noted. He is not diaphoretic. No erythema.    Lab Review:     Component Value Date/Time   NA 138 01/03/2017 1037   K 2.9 (LL) 01/03/2017 1037   CO2 25 01/03/2017 1037   GLUCOSE 135 01/03/2017 1037   BUN 25.0 01/03/2017 1037   CREATININE 1.0 01/03/2017 1037   CALCIUM 9.2 01/03/2017 1037   PROT 6.0 (L) 01/03/2017 1037   ALBUMIN  2.8 (L) 01/03/2017 1037   AST 15 01/03/2017 1037   ALT 14 01/03/2017 1037   ALKPHOS 73 01/03/2017 1037   BILITOT 1.52 (H) 01/03/2017 1037       Component Value Date/Time   WBC 15.9 (H) 01/03/2017 1037   RBC 3.85 (L) 01/03/2017 1037   HGB 11.5 (L) 01/03/2017 1037   HCT 34.6 (L) 01/03/2017 1037   PLT 326 01/03/2017 1037   MCV 89.9 01/03/2017 1037   MCH 29.7 01/03/2017 1037   MCHC 33.1 01/03/2017 1037   RDW 15.2 (H) 01/03/2017 1037   LYMPHSABS 1.5 01/03/2017 1037   MONOABS 1.8 (H) 01/03/2017 1037   EOSABS 0.0 01/03/2017 1037   BASOSABS 0.0 01/03/2017 1037   -------------------------------  Imaging from last 24 hours (if applicable):  Radiology interpretation: Dg Chest 1 View  Result Date: 12/23/2016 CLINICAL DATA:  Port placement EXAM: CHEST  1 VIEW COMPARISON:  None. FINDINGS: The tip of the right jugular Port-A-Cath projects over the cavoatrial junction based on a single PA view. There is no lateral view of the chest for more accurate confirmation. No pneumothorax. No pleural effusion. Normal heart size. No consolidation or lung mass. IMPRESSION: Right jugular Port-A-Cath tip projects over the cavoatrial junction. No active cardiopulmonary disease. Electronically Signed   By: Marybelle Killings M.D.   On: 12/23/2016 11:07   Dg Chest 2 View  Result Date: 01/03/2017 CLINICAL DATA:  Generalize weakness for the past week with multiple episodes of vomiting over the past 3 days. Fell early this morning. Possible dehydration. EXAM: CHEST  2 VIEW COMPARISON:  Portable chest x-ray of December 22, 2016 FINDINGS: The lungs are mildly hyperinflated but clear. There is no pleural effusion or pneumothorax. The heart and pulmonary vascularity are normal. The mediastinum is normal in width. There is multilevel degenerative disc disease of the thoracic spine. The power port catheter tip projects over the distal third of the SVC. IMPRESSION: Chronic bronchitic changes, stable. No pneumonia, CHF, nor other  acute cardiopulmonary abnormality. Electronically Signed   By: David  Martinique M.D.   On: 01/03/2017 13:43   Dg Abd 2 Views  Result Date: 01/03/2017 CLINICAL DATA:  Lies weakness for the past week  with multiple episodes of vomiting over the past 3 days. Patient ports falling in the bathtub early this morning. Possible dehydration. EXAM: ABDOMEN - 2 VIEW COMPARISON:  None in PACs FINDINGS: The lung bases are clear. The bowel gas pattern is normal. There are exuberant splenic artery calcifications. There is calcification in the wall of the abdominal aorta. There are phleboliths within the pelvis. The observed portions of the lumbar spine, pelvis, and hips exhibit no acute abnormalities. IMPRESSION: No acute intra-abdominal abnormality is observed. Electronically Signed   By: David  Martinique M.D.   On: 01/03/2017 13:41        This case was discussed with Dr. Burr Medico. She expressed agreement with my management of this patient.

## 2017-01-04 ENCOUNTER — Ambulatory Visit (HOSPITAL_BASED_OUTPATIENT_CLINIC_OR_DEPARTMENT_OTHER): Payer: Medicare Other | Admitting: Medical

## 2017-01-04 ENCOUNTER — Encounter: Payer: Self-pay | Admitting: Adult Health

## 2017-01-04 ENCOUNTER — Telehealth: Payer: Self-pay | Admitting: *Deleted

## 2017-01-04 ENCOUNTER — Ambulatory Visit (INDEPENDENT_AMBULATORY_CARE_PROVIDER_SITE_OTHER): Payer: Medicare Other | Admitting: Adult Health

## 2017-01-04 ENCOUNTER — Telehealth: Payer: Self-pay | Admitting: Medical

## 2017-01-04 VITALS — BP 114/54 | HR 51 | Temp 99.0°F | Resp 18 | Ht 70.0 in | Wt 148.0 lb

## 2017-01-04 VITALS — BP 74/42 | Temp 98.4°F | Ht 70.0 in | Wt 148.0 lb

## 2017-01-04 DIAGNOSIS — C250 Malignant neoplasm of head of pancreas: Secondary | ICD-10-CM

## 2017-01-04 DIAGNOSIS — R112 Nausea with vomiting, unspecified: Secondary | ICD-10-CM

## 2017-01-04 DIAGNOSIS — Z7689 Persons encountering health services in other specified circumstances: Secondary | ICD-10-CM | POA: Diagnosis not present

## 2017-01-04 DIAGNOSIS — E860 Dehydration: Secondary | ICD-10-CM | POA: Diagnosis not present

## 2017-01-04 DIAGNOSIS — R630 Anorexia: Secondary | ICD-10-CM | POA: Diagnosis not present

## 2017-01-04 DIAGNOSIS — E119 Type 2 diabetes mellitus without complications: Secondary | ICD-10-CM | POA: Diagnosis not present

## 2017-01-04 DIAGNOSIS — R109 Unspecified abdominal pain: Secondary | ICD-10-CM | POA: Diagnosis not present

## 2017-01-04 DIAGNOSIS — E861 Hypovolemia: Secondary | ICD-10-CM | POA: Diagnosis not present

## 2017-01-04 DIAGNOSIS — E782 Mixed hyperlipidemia: Secondary | ICD-10-CM

## 2017-01-04 DIAGNOSIS — E876 Hypokalemia: Secondary | ICD-10-CM

## 2017-01-04 DIAGNOSIS — K219 Gastro-esophageal reflux disease without esophagitis: Secondary | ICD-10-CM | POA: Diagnosis not present

## 2017-01-04 DIAGNOSIS — R197 Diarrhea, unspecified: Secondary | ICD-10-CM

## 2017-01-04 DIAGNOSIS — Z794 Long term (current) use of insulin: Secondary | ICD-10-CM

## 2017-01-04 DIAGNOSIS — I9589 Other hypotension: Secondary | ICD-10-CM | POA: Diagnosis not present

## 2017-01-04 DIAGNOSIS — E785 Hyperlipidemia, unspecified: Secondary | ICD-10-CM | POA: Insufficient documentation

## 2017-01-04 LAB — COMPREHENSIVE METABOLIC PANEL
ALT: 15 U/L (ref 0–55)
ANION GAP: 10 meq/L (ref 3–11)
AST: 21 U/L (ref 5–34)
Albumin: 2.6 g/dL — ABNORMAL LOW (ref 3.5–5.0)
Alkaline Phosphatase: 74 U/L (ref 40–150)
BUN: 29.4 mg/dL — ABNORMAL HIGH (ref 7.0–26.0)
CHLORIDE: 104 meq/L (ref 98–109)
CO2: 26 meq/L (ref 22–29)
CREATININE: 1.1 mg/dL (ref 0.7–1.3)
Calcium: 9.2 mg/dL (ref 8.4–10.4)
EGFR: >60 mL/min/{1.73_m2} (ref >60)
Glucose: 82 mg/dl (ref 70–140)
POTASSIUM: 2.8 meq/L — AB (ref 3.5–5.1)
Sodium: 140 mEq/L (ref 136–145)
Total Bilirubin: 1.15 mg/dL (ref 0.20–1.20)
Total Protein: 6 g/dL — ABNORMAL LOW (ref 6.4–8.3)

## 2017-01-04 MED ORDER — GLUCOSE BLOOD VI STRP: ORAL_STRIP | 12 refills | Status: AC

## 2017-01-04 MED ORDER — SODIUM CHLORIDE 0.9 % IV SOLN
Freq: Once | INTRAVENOUS | Status: AC
  Administered 2017-01-04: 12:00:00 via INTRAVENOUS

## 2017-01-04 MED ORDER — FAMOTIDINE IN NACL 20-0.9 MG/50ML-% IV SOLN
20.0000 mg | Freq: Once | INTRAVENOUS | Status: AC
  Administered 2017-01-04: 20 mg via INTRAVENOUS

## 2017-01-04 MED ORDER — FAMOTIDINE IN NACL 20-0.9 MG/50ML-% IV SOLN
INTRAVENOUS | Status: AC
  Filled 2017-01-04: qty 50

## 2017-01-04 MED ORDER — SODIUM CHLORIDE 0.9 % IV SOLN
Freq: Once | INTRAVENOUS | Status: AC
  Administered 2017-01-04: 14:00:00 via INTRAVENOUS
  Filled 2017-01-04: qty 1000

## 2017-01-04 MED ORDER — "INSULIN SYRINGE 30G X 5/16"" 1 ML MISC": 12 refills | Status: DC

## 2017-01-04 MED ORDER — ONETOUCH ULTRASOFT LANCETS MISC: 12 refills | Status: DC

## 2017-01-04 MED ORDER — SODIUM CHLORIDE 0.9% FLUSH
10.0000 mL | Freq: Once | INTRAVENOUS | Status: AC
  Administered 2017-01-04: 10 mL via INTRAVENOUS
  Filled 2017-01-04: qty 10

## 2017-01-04 MED ORDER — HEPARIN SOD (PORK) LOCK FLUSH 100 UNIT/ML IV SOLN
500.0000 [IU] | Freq: Once | INTRAVENOUS | Status: AC
  Administered 2017-01-04: 500 [IU] via INTRAVENOUS
  Filled 2017-01-04: qty 5

## 2017-01-04 NOTE — Progress Notes (Signed)
Pt returns to [facility]  Today for ongoing hypotension and dehydration. Has not been able to take po KCL or as much fluids as he needs. Hypotensive @ new PCP office visit this am.  Denies vomiting but states he feels queazy..  Had large diarrheal stool this am.  Port accessed and labs obtained-BMP and blood cultures. 2nd set of blood cultures obtained per lab tech. Fluids infusing @ 574ml/hr  Given IV KCL 40 meq over 4 hours. Port left accessed as pt returning tomorrow for additional fluids/KCL and CT Scan.

## 2017-01-04 NOTE — Telephone Encounter (Signed)
Scheduled appt per 12/4 sch message - patient is aware

## 2017-01-04 NOTE — Progress Notes (Addendum)
Symptoms Management Clinic Progress Note   Cody Mckay [id number] 05-28-1932 83 y.o.  Lucille Crichlow is managed by Dr. Truitt Merle   Actively treated with chemotherapy: yes  Current Therapy: Abraxane and gemcitabine  Last Treated: Approximately 2 and half weeks ago  Assessment: Plan:    Malignant neoplasm of head of pancreas ([place]) - Plan: CT Abdomen Pelvis W Contrast  Dehydration - Plan: Basic metabolic panel, Culture, Blood, Culture, Blood, 0.9 %  sodium chloride infusion  Hypokalemia - Plan: sodium chloride 0.9 % 1,000 mL with potassium chloride 40 mEq infusion  Gastroesophageal reflux disease, esophagitis presence not specified - Plan: famotidine (PEPCID) IVPB 20 mg premix   Malignant neoplasm of the head of the pancreas with anorexia, diarrhea, and nausea: The patient has been referred for a CT of the abdomen and pelvis tomorrow.  Dehydration: The patient was given 2 L of IV fluid today.  He will return tomorrow for additional IV fluids.  Plans had been to either directly admit the patient or admit the patient to the emergency room however at that time there were no beds available at [facility] for direct admission and there were at least 20 patients waiting in the emergency room for beds.  Hypokalemia: The patient was given 40 mEq of IV potassium chloride today.  GERD: The patient was given Pepcid 20 mg IV today.  Please see After Visit Summary for patient specific instructions.  Future Appointments  Date Time Provider [place]  01/05/2017 10:30 AM Sandi Mealy E., PA-C [facility] None  01/05/2017  2:30 PM [facility]-CT 2 [facility]-CT [place]  01/07/2017 10:00 AM [facility] LAB 5 [facility] None  01/07/2017 10:15 AM [facility] B6 [facility] None  01/07/2017 10:45 AM Truitt Merle, MD [facility] None  01/07/2017 11:45 AM [facility] J32 DNS [facility] None  01/14/2017 10:45 AM [facility] LAB 3 [facility] None  01/14/2017 11:00 AM [facility] FLUSH NURSE 2  [facility] None  01/14/2017 11:30 AM Alla Feeling, NP [facility] None  01/14/2017 12:15 PM [facility] F20 [facility] None    Orders Placed This Encounter  Procedures  . Culture, Blood  . Culture, Blood  . CT Abdomen Pelvis W Contrast  . Basic metabolic panel       Subjective:   Patient ID:  Keishawn Rajewski is a 83 y.o. (DOB 28-Sep-1932) male.  Chief Complaint:  Chief Complaint  Patient presents with  . Hypotension    HPI Aldwin Micalizzi is an 83 year old male with a recent diagnosis of a clinical stage IIb (cT2, cN1, cM0) pancreatic cancer.  He was last seen by Dr. Burr Medico for his initial appointment on 12/22/2016.  He was seen yesterday for nausea, vomiting, and dehydration.  He was noted to have hypo-kalemia with a potassium of 2.9.  He was given 20 mEq of IV potassium x1.  He was also noted to have leukocytosis with a white count of 15.9 with 78.6% neutrophils.  His physical exam showed bowel sounds that were decreased to absent.  He was sent for a KUB which returned showing no evidence of an obstruction.  A chest x-ray was completed which returned negative.  A urinalysis was completed which showed no evidence of a UTI.  He presents to the office today with continued nausea, anorexia, diarrhea, and abdominal pain.  He had an episode of fecal incontinence this morning.  He had 4-5 episodes of diarrhea yesterday.  Labs completed this morning showed a potassium of 2.8.  Dr. Truitt Merle had originally wanted to have the patient  admitted either directly or through the emergency room however there were no beds available earlier today and there were at least 20 patients in the emergency room waiting for beds for admission.  Medications: I have reviewed the patient's current medications.  Allergies:  Allergies  Allergen Reactions  . Codeine Other (See Comments)    Schizophrenic reactions.    Past Medical History:  Diagnosis Date  . Chicken pox   . Diabetes mellitus without complication  ([place])   . Diverticulitis   . Gastric ulcer   . Hyperlipidemia   . Hypertension     Past Surgical History:  Procedure Laterality Date  . REPAIR OF PERFORATED ULCER    . TONSILLECTOMY     pt was 47 at time of removal    Family History  Problem Relation Age of Onset  . Cancer Father        lung cancer  . Cancer Son        testicular cancer     Social History   Socioeconomic History  . Marital status: Married    Spouse name: Not on file  . Number of children: Not on file  . Years of education: Not on file  . Highest education level: Not on file  Social Needs  . Financial resource strain: Not on file  . Food insecurity - worry: Not on file  . Food insecurity - inability: Not on file  . Transportation needs - medical: Not on file  . Transportation needs - non-medical: Not on file  Occupational History  . Not on file  Tobacco Use  . Smoking status: Never Smoker  . Smokeless tobacco: Never Used  Substance and Sexual Activity  . Alcohol use: No    Frequency: Never  . Drug use: No  . Sexual activity: Not on file  Other Topics Concern  . Not on file  Social History Narrative  . Not on file    Past Medical History, Surgical history, Social history, and Family history were reviewed and updated as appropriate.   Please see review of systems for further details on the patient's review from today.   Review of Systems:  Review of Systems  Constitutional: Positive for activity change, appetite change, chills and fatigue. Negative for diaphoresis and fever.  HENT: Negative for trouble swallowing.   Respiratory: Negative for cough, choking and shortness of breath.   Cardiovascular: Negative for chest pain and leg swelling.  Gastrointestinal: Positive for abdominal pain and vomiting. Negative for diarrhea and nausea.  Neurological: Positive for weakness.    Objective:   Physical Exam:  BP (!) 118/59 (BP Location: Left Arm, Patient Position: Sitting)   Pulse (!) 48    Temp (!) 97.4 F (36.3 C) (Oral)   Resp 18   Ht 5\' 10"  (1.778 m)   Wt 148 lb (67.1 kg)   SpO2 100%   BMI 21.24 kg/m  ECOG: 2  Physical Exam  Constitutional:  The patient is an elderly chronically ill-appearing male who is ambulating with the use of a wheelchair.  HENT:  Head: Normocephalic and atraumatic.  Mouth/Throat: Oropharynx is clear and moist. No oropharyngeal exudate.  Cardiovascular: Normal rate, regular rhythm and normal heart sounds. Exam reveals no gallop and no friction rub.  No murmur heard. Pulmonary/Chest: Effort normal and breath sounds normal. No respiratory distress. He has no wheezes. He has no rales.  Abdominal: Soft. He exhibits no distension. Bowel sounds are decreased. There is no tenderness. There is no rebound and no  guarding.  Neurological: Coordination (Patient does ambulate with use of a wheelchair) abnormal.  Skin: Skin is warm and dry. He is not diaphoretic.    Lab Review:     Component Value Date/Time   NA 140 01/04/2017 1157   K 2.8 (LL) 01/04/2017 1157   CO2 26 01/04/2017 1157   GLUCOSE 82 01/04/2017 1157   BUN 29.4 (H) 01/04/2017 1157   CREATININE 1.1 01/04/2017 1157   CALCIUM 9.2 01/04/2017 1157   PROT 6.0 (L) 01/04/2017 1157   ALBUMIN 2.6 (L) 01/04/2017 1157   AST 21 01/04/2017 1157   ALT 15 01/04/2017 1157   ALKPHOS 74 01/04/2017 1157   BILITOT 1.15 01/04/2017 1157       Component Value Date/Time   WBC 15.9 (H) 01/03/2017 1037   RBC 3.85 (L) 01/03/2017 1037   HGB 11.5 (L) 01/03/2017 1037   HCT 34.6 (L) 01/03/2017 1037   PLT 326 01/03/2017 1037   MCV 89.9 01/03/2017 1037   MCH 29.7 01/03/2017 1037   MCHC 33.1 01/03/2017 1037   RDW 15.2 (H) 01/03/2017 1037   LYMPHSABS 1.5 01/03/2017 1037   MONOABS 1.8 (H) 01/03/2017 1037   EOSABS 0.0 01/03/2017 1037   BASOSABS 0.0 01/03/2017 1037   -------------------------------  Imaging from last 24 hours (if applicable):  Radiology interpretation: Dg Chest 1 View  Result Date:  12/23/2016 CLINICAL DATA:  Port placement EXAM: CHEST  1 VIEW COMPARISON:  None. FINDINGS: The tip of the right jugular Port-A-Cath projects over the cavoatrial junction based on a single PA view. There is no lateral view of the chest for more accurate confirmation. No pneumothorax. No pleural effusion. Normal heart size. No consolidation or lung mass. IMPRESSION: Right jugular Port-A-Cath tip projects over the cavoatrial junction. No active cardiopulmonary disease. Electronically Signed   By: Marybelle Killings M.D.   On: 12/23/2016 11:07   Dg Chest 2 View  Result Date: 01/03/2017 CLINICAL DATA:  Generalize weakness for the past week with multiple episodes of vomiting over the past 3 days. Fell early this morning. Possible dehydration. EXAM: CHEST  2 VIEW COMPARISON:  Portable chest x-ray of December 22, 2016 FINDINGS: The lungs are mildly hyperinflated but clear. There is no pleural effusion or pneumothorax. The heart and pulmonary vascularity are normal. The mediastinum is normal in width. There is multilevel degenerative disc disease of the thoracic spine. The power port catheter tip projects over the distal third of the SVC. IMPRESSION: Chronic bronchitic changes, stable. No pneumonia, CHF, nor other acute cardiopulmonary abnormality. Electronically Signed   By: David  Martinique M.D.   On: 01/03/2017 13:43   Dg Abd 2 Views  Result Date: 01/03/2017 CLINICAL DATA:  Lies weakness for the past week with multiple episodes of vomiting over the past 3 days. Patient ports falling in the bathtub early this morning. Possible dehydration. EXAM: ABDOMEN - 2 VIEW COMPARISON:  None in PACs FINDINGS: The lung bases are clear. The bowel gas pattern is normal. There are exuberant splenic artery calcifications. There is calcification in the wall of the abdominal aorta. There are phleboliths within the pelvis. The observed portions of the lumbar spine, pelvis, and hips exhibit no acute abnormalities. IMPRESSION: No acute  intra-abdominal abnormality is observed. Electronically Signed   By: David  Martinique M.D.   On: 01/03/2017 13:41        This patient was seen with Dr. Burr Medico with my treatment plan reviewed with her. She expressed agreement with my medical management of this patient.  Addendum  I have seen the patient, examined him. I agree with the assessment and and plan and have edited the notes.   Mr. Mattern has been doing poorly in the past 3-4 days, was seen by [facility] yesterday and returned today with similar complains.  He was found to be hypotensive yesterday and today when checked in.  He responded well to IV fluids.  I initially recommended hospital admission for further management and a CT scan, however bed was not available. We did blood cultures, and plan to check his stool for c-diff. Will arrange CT abd/pel with contrast to be done tomororw. He will return to [facility] again tomorrow for IVF and CT.   Truitt Merle  01/05/2017

## 2017-01-04 NOTE — Progress Notes (Signed)
Patient presents to clinic today to establish care. He is a pleasant 83 year old male who  has a past medical history of Chicken pox, Diabetes mellitus without complication ([place]), Diverticulitis, Gastric ulcer, Hyperlipidemia, and Hypertension.   Recently moved to [facility] from [place] to be closer to family as he goes through treatment for pancreatic cancer.    Acute Concerns: Establish Care   Chronic Issues: Pancreatic Cancer - Is being managed by Dr. Burr Medico. Was recently receiving chemo in [place], last treatment two weeks ago. Has pending chemo here on 01/07/2017. He was seen in oncology clinic yesterday for dehydration,  Weakness, and n/v. He was given 2 L NS with 20 meq IV K while in the clinic for a K of 2.9 ( he did not take his prescribed potassium over the weekend).  Today in the office he reports that he continues to feel weak and tired. He does not have an appetite and continues to have diarrhea.    Hypertension - He is prescribed Norvasc 5 mg. Has not been taking due to low blood pressure readings. Today in the office 74/42. He does report feeling lightheaded and dizzy   Hyperlipidemia - Takes lipitor 10 mg   Diabetes - Lantus and Novolog . Home blood sugar readings between 79 - 180's.     Health Maintenance: Dental -- ? Vision -- 2017  Immunizations -- UTD  Colonoscopy -- No longer needs, last was in 2011   Past Medical History:  Diagnosis Date  . Chicken pox   . Diabetes mellitus without complication ([place])   . Diverticulitis   . Gastric ulcer   . Hyperlipidemia   . Hypertension     Past Surgical History:  Procedure Laterality Date  . REPAIR OF PERFORATED ULCER    . TONSILLECTOMY     pt was 47 at time of removal    Current Outpatient Medications on File Prior to Visit  Medication Sig Dispense Refill  . acetaminophen (TYLENOL) 500 MG tablet Take 500 mg by mouth every 6 (six) hours as needed.    Marland Kitchen alfuzosin (UROXATRAL) 10 MG 24 hr tablet     .  atorvastatin (LIPITOR) 10 MG tablet     . Calcium-Magnesium-Vitamin D (CALCIUM 500 PO) Take 1 tablet by mouth daily.    . diphenoxylate-atropine (LOMOTIL) 2.5-0.025 MG tablet     . esomeprazole (NEXIUM) 40 MG capsule Take 2 capsules by mouth daily.    . insulin aspart (NOVOLOG) 100 UNIT/ML injection Inject 5 Units into the skin 3 (three) times daily before meals.    Marland Kitchen LANTUS SOLOSTAR 100 UNIT/ML Solostar Pen Inject 15 Units into the skin daily.    Marland Kitchen lidocaine-prilocaine (EMLA) cream Apply 1 application topically as needed. 30 g 2  . NIASPAN 750 MG CR tablet Take 750 mg by mouth 2 (two) times daily.     . ondansetron (ZOFRAN) 8 MG tablet     . potassium chloride SA (K-DUR,KLOR-CON) 20 MEQ tablet Take 60 mEq by mouth daily.     . prochlorperazine (COMPAZINE) 10 MG tablet as needed.    . triamcinolone ointment (KENALOG) 0.1 % as needed.    Marland Kitchen amLODipine (NORVASC) 5 MG tablet      No current facility-administered medications on file prior to visit.     Allergies  Allergen Reactions  . Codeine Other (See Comments)    Schizophrenic reactions.    Family History  Problem Relation Age of Onset  . Cancer Father  lung cancer  . Cancer Son        testicular cancer     Social History   Socioeconomic History  . Marital status: Married    Spouse name: Not on file  . Number of children: Not on file  . Years of education: Not on file  . Highest education level: Not on file  Social Needs  . Financial resource strain: Not on file  . Food insecurity - worry: Not on file  . Food insecurity - inability: Not on file  . Transportation needs - medical: Not on file  . Transportation needs - non-medical: Not on file  Occupational History  . Not on file  Tobacco Use  . Smoking status: Never Smoker  . Smokeless tobacco: Never Used  Substance and Sexual Activity  . Alcohol use: No    Frequency: Never  . Drug use: No  . Sexual activity: Not on file  Other Topics Concern  . Not on file    Social History Narrative  . Not on file    Review of Systems  Constitutional: Positive for malaise/fatigue and weight loss.  Respiratory: Negative.   Cardiovascular: Negative.   Gastrointestinal: Positive for diarrhea and nausea. Negative for blood in stool and melena.  Musculoskeletal: Negative.   Neurological: Positive for dizziness and weakness.  All other systems reviewed and are negative.   BP (!) 74/42 (BP Location: Left Arm)   Temp 98.4 F (36.9 C) (Axillary) Comment (Src): PT'S WIFE REPORTED/TAKEN AT HOME  Ht 5' 10"  (1.778 m)   Wt 148 lb (67.1 kg)   BMI 21.24 kg/m   Physical Exam  Constitutional: He is oriented to person, place, and time and well-developed, well-nourished, and in no distress. He appears cachectic. He has a sickly appearance. No distress.  Cardiovascular: Normal rate, regular rhythm, normal heart sounds and intact distal pulses. Exam reveals no gallop and no friction rub.  No murmur heard. Pulmonary/Chest: Effort normal and breath sounds normal. No respiratory distress. He has no wheezes. He has no rales. He exhibits no tenderness.  Neurological: He is alert and oriented to person, place, and time. Gait normal.  Skin: Skin is warm and dry. No rash noted. He is not diaphoretic. No erythema. No pallor.  Psychiatric: Memory, affect and judgment normal.  Nursing note and vitals reviewed.   Recent Results (from the past 2160 hour(s))  CA 19.9     Status: Abnormal   Collection Time: 12/22/16  3:54 PM  Result Value Ref Range   CA 19-9 36 (H) 0 - 35 U/mL    Comment: Roche ECLIA methodology  Iron and TIBC     Status: Abnormal   Collection Time: 12/22/16  3:54 PM  Result Value Ref Range   Iron 24 (L) 42 - 163 ug/dL   TIBC 166 (L) 202 - 409 ug/dL   UIBC 143 117 - 376 ug/dL   %SAT 14 (L) 20 - 55 %  Ferritin     Status: Abnormal   Collection Time: 12/22/16  3:54 PM  Result Value Ref Range   Ferritin 570 (H) 22 - 316 ng/ml  Comprehensive metabolic panel      Status: Abnormal   Collection Time: 12/22/16  3:54 PM  Result Value Ref Range   Sodium 138 136 - 145 mEq/L   Potassium 3.3 (L) 3.5 - 5.1 mEq/L   Chloride 104 98 - 109 mEq/L   CO2 26 22 - 29 mEq/L   Glucose 199 (H) 70 - 140  mg/dl    Comment: Glucose reference range is for nonfasting patients. Fasting glucose reference range is 70- 100.   BUN 11.8 7.0 - 26.0 mg/dL   Creatinine 0.8 0.7 - 1.3 mg/dL   Total Bilirubin 0.62 0.20 - 1.20 mg/dL   Alkaline Phosphatase 91 40 - 150 U/L   AST 27 5 - 34 U/L   ALT 37 0 - 55 U/L   Total Protein 6.0 (L) 6.4 - 8.3 g/dL   Albumin 3.0 (L) 3.5 - 5.0 g/dL   Calcium 9.1 8.4 - 10.4 mg/dL   Anion Gap 8 3 - 11 mEq/L   EGFR >60 >60 ml/min/1.73 m2    Comment: eGFR is calculated using the CKD-EPI Creatinine Equation (2009)  CBC with Differential     Status: Abnormal   Collection Time: 12/22/16  3:54 PM  Result Value Ref Range   WBC 3.0 (L) 4.0 - 10.3 10e3/uL   NEUT# 1.7 1.5 - 6.5 10e3/uL   HGB 12.4 (L) 13.0 - 17.1 g/dL   HCT 37.4 (L) 38.4 - 49.9 %   Platelets 109 (L) 140 - 400 10e3/uL   MCV 89.3 79.3 - 98.0 fL   MCH 29.5 27.2 - 33.4 pg   MCHC 33.1 32.0 - 36.0 g/dL   RBC 4.19 (L) 4.20 - 5.82 10e6/uL   RDW 13.7 11.0 - 14.6 %   lymph# 1.2 0.9 - 3.3 10e3/uL   MONO# 0.1 0.1 - 0.9 10e3/uL   Eosinophils Absolute 0.0 0.0 - 0.5 10e3/uL   Basophils Absolute 0.0 0.0 - 0.1 10e3/uL   NEUT% 56.2 39.0 - 75.0 %   LYMPH% 39.5 14.0 - 49.0 %   MONO% 2.9 0.0 - 14.0 %   EOS% 0.7 0.0 - 7.0 %   BASO% 0.7 0.0 - 2.0 %  TECHNOLOGIST REVIEW     Status: None   Collection Time: 12/22/16  3:54 PM  Result Value Ref Range   Technologist Review      Sl poly, Few ovalos, burr, schistocytes, and helmet  Comprehensive metabolic panel     Status: Abnormal   Collection Time: 01/03/17 10:37 AM  Result Value Ref Range   Sodium 138 136 - 145 mEq/L   Potassium 2.9 (LL) 3.5 - 5.1 mEq/L   Chloride 103 98 - 109 mEq/L   CO2 25 22 - 29 mEq/L   Glucose 135 70 - 140 mg/dl    Comment:  Glucose reference range is for nonfasting patients. Fasting glucose reference range is 70- 100.   BUN 25.0 7.0 - 26.0 mg/dL   Creatinine 1.0 0.7 - 1.3 mg/dL   Total Bilirubin 1.52 (H) 0.20 - 1.20 mg/dL   Alkaline Phosphatase 73 40 - 150 U/L   AST 15 5 - 34 U/L   ALT 14 0 - 55 U/L   Total Protein 6.0 (L) 6.4 - 8.3 g/dL   Albumin 2.8 (L) 3.5 - 5.0 g/dL   Calcium 9.2 8.4 - 10.4 mg/dL   Anion Gap 10 3 - 11 mEq/L   EGFR >60 >60 ml/min/1.73 m2    Comment: eGFR is calculated using the CKD-EPI Creatinine Equation (2009)  CBC with Differential     Status: Abnormal   Collection Time: 01/03/17 10:37 AM  Result Value Ref Range   WBC 15.9 (H) 4.0 - 10.3 10e3/uL   NEUT# 12.5 (H) 1.5 - 6.5 10e3/uL   HGB 11.5 (L) 13.0 - 17.1 g/dL   HCT 34.6 (L) 38.4 - 49.9 %   Platelets 326 140 - 400  10e3/uL   MCV 89.9 79.3 - 98.0 fL   MCH 29.7 27.2 - 33.4 pg   MCHC 33.1 32.0 - 36.0 g/dL   RBC 3.85 (L) 4.20 - 5.82 10e6/uL   RDW 15.2 (H) 11.0 - 14.6 %   lymph# 1.5 0.9 - 3.3 10e3/uL   MONO# 1.8 (H) 0.1 - 0.9 10e3/uL   Eosinophils Absolute 0.0 0.0 - 0.5 10e3/uL   Basophils Absolute 0.0 0.0 - 0.1 10e3/uL   NEUT% 78.6 (H) 39.0 - 75.0 %   LYMPH% 9.5 (L) 14.0 - 49.0 %   MONO% 11.6 0.0 - 14.0 %   EOS% 0.0 0.0 - 7.0 %   BASO% 0.3 0.0 - 2.0 %  Urinalysis with microscopic     Status: None   Collection Time: 01/03/17  2:27 PM  Result Value Ref Range   Glucose Negative Negative mg/dL   Bilirubin (Urine) Negative Negative   Ketones 5 Negative mg/dL   Specific Gravity, Urine 1.020 1.003 - 1.035   Blood Negative Negative   pH 6.0 4.6 - 8.0   Protein < 30 Negative- <30 mg/dL   Urobilinogen, UR 0.2 0.2 - 1 mg/dL   Nitrite Negative Negative   Leukocyte Esterase Negative Negative   RBC / HPF Negative 0 - 2   WBC, UA 0-2 0-2;Negative   Crystals Ca Oxalate Negative   Epithelial Cells Occasional Negative- Few   Mucus, UA Large Negative- Small    Assessment/Plan: 1. Encounter to establish care - Follow up as  needed   2. Hypotension due to hypovolemia - Follow up at Oncology clinic  - No BP meds at this time   3. Malignant neoplasm of head of pancreas [facility]) - Starts treatment on Friday  - Do to condition of patient in the office today advised family to follow up with Oncology clinic about being seen today   4. Type 2 diabetes mellitus without complication, with long-term current use of insulin ([place]) - Diabetic supplies sent to pharmacy   5. Mixed hyperlipidemia - Continue lipitor   6. Decreased appetite - Note sent to Oncologist about starting Remeron    Dorothyann Peng, NP

## 2017-01-04 NOTE — Telephone Encounter (Signed)
Received call from Triage/Roz RN stating family member called reporting pt dizzy, light-headed, diarrhea, & low BP.  Called & spoke to Darrold Span, son in law & her reports pt was seen by PCP & suggested that he f/u with oncology.  Req that he come on in.  Scheduling message sent for 11:30 am appt.with symptom management to eval for IVF.

## 2017-01-05 ENCOUNTER — Ambulatory Visit (HOSPITAL_BASED_OUTPATIENT_CLINIC_OR_DEPARTMENT_OTHER): Payer: Medicare Other

## 2017-01-05 ENCOUNTER — Ambulatory Visit (HOSPITAL_BASED_OUTPATIENT_CLINIC_OR_DEPARTMENT_OTHER): Payer: Medicare Other | Admitting: Medical

## 2017-01-05 ENCOUNTER — Ambulatory Visit (HOSPITAL_COMMUNITY)
Admission: RE | Admit: 2017-01-05 | Discharge: 2017-01-05 | Disposition: A | Discharge status: Home / Self Care | Payer: Medicare Other | Source: Ambulatory Visit | Attending: Medical | Admitting: Medical

## 2017-01-05 ENCOUNTER — Other Ambulatory Visit: Payer: Self-pay | Admitting: Medical

## 2017-01-05 ENCOUNTER — Other Ambulatory Visit (HOSPITAL_COMMUNITY)
Admission: RE | Admit: 2017-01-05 | Discharge: 2017-01-05 | Disposition: A | Discharge status: Home / Self Care | Payer: Medicare Other | Source: Ambulatory Visit | Attending: Medical | Admitting: Medical

## 2017-01-05 VITALS — BP 125/60 | HR 56 | Temp 97.6°F | Resp 18 | Ht 70.0 in | Wt 153.5 lb

## 2017-01-05 DIAGNOSIS — R112 Nausea with vomiting, unspecified: Secondary | ICD-10-CM

## 2017-01-05 DIAGNOSIS — C250 Malignant neoplasm of head of pancreas: Secondary | ICD-10-CM | POA: Diagnosis present

## 2017-01-05 DIAGNOSIS — I700 Atherosclerosis of aorta: Secondary | ICD-10-CM | POA: Insufficient documentation

## 2017-01-05 DIAGNOSIS — Z8507 Personal history of malignant neoplasm of pancreas: Secondary | ICD-10-CM

## 2017-01-05 DIAGNOSIS — R197 Diarrhea, unspecified: Secondary | ICD-10-CM

## 2017-01-05 DIAGNOSIS — R630 Anorexia: Secondary | ICD-10-CM

## 2017-01-05 DIAGNOSIS — I701 Atherosclerosis of renal artery: Secondary | ICD-10-CM

## 2017-01-05 DIAGNOSIS — E876 Hypokalemia: Secondary | ICD-10-CM

## 2017-01-05 DIAGNOSIS — R110 Nausea: Secondary | ICD-10-CM

## 2017-01-05 LAB — CBC WITH DIFFERENTIAL/PLATELET
BASO%: 0.1 % (ref 0.0–2.0)
Basophils Absolute: 0 10*3/uL (ref 0.0–0.1)
EOS%: 0.1 % (ref 0.0–7.0)
Eosinophils Absolute: 0 10*3/uL (ref 0.0–0.5)
HCT: 31.2 % — ABNORMAL LOW (ref 38.4–49.9)
HGB: 10.3 g/dL — ABNORMAL LOW (ref 13.0–17.1)
LYMPH%: 6.8 % — AB (ref 14.0–49.0)
MCH: 30 pg (ref 27.2–33.4)
MCHC: 33 g/dL (ref 32.0–36.0)
MCV: 91 fL (ref 79.3–98.0)
MONO#: 2.2 10*3/uL — ABNORMAL HIGH (ref 0.1–0.9)
MONO%: 16.3 % — ABNORMAL HIGH (ref 0.0–14.0)
NEUT#: 10.4 10*3/uL — ABNORMAL HIGH (ref 1.5–6.5)
NEUT%: 76.7 % — AB (ref 39.0–75.0)
Platelets: 257 10*3/uL (ref 140–400)
RBC: 3.43 10*6/uL — AB (ref 4.20–5.82)
RDW: 15.4 % — ABNORMAL HIGH (ref 11.0–14.6)
WBC: 13.5 10*3/uL — ABNORMAL HIGH (ref 4.0–10.3)
lymph#: 0.9 10*3/uL (ref 0.9–3.3)

## 2017-01-05 LAB — COMPREHENSIVE METABOLIC PANEL
ALT: 28 U/L (ref 0–55)
ANION GAP: 9 meq/L (ref 3–11)
AST: 54 U/L — AB (ref 5–34)
Albumin: 2.4 g/dL — ABNORMAL LOW (ref 3.5–5.0)
Alkaline Phosphatase: 86 U/L (ref 40–150)
BUN: 28 mg/dL — AB (ref 7.0–26.0)
CHLORIDE: 111 meq/L — AB (ref 98–109)
CO2: 20 meq/L — AB (ref 22–29)
Calcium: 8.9 mg/dL (ref 8.4–10.4)
Creatinine: 0.9 mg/dL (ref 0.7–1.3)
EGFR: >60 mL/min/{1.73_m2} (ref >60)
GLUCOSE: 94 mg/dL (ref 70–140)
POTASSIUM: 3.4 meq/L — AB (ref 3.5–5.1)
Sodium: 140 mEq/L (ref 136–145)
Total Bilirubin: 0.74 mg/dL (ref 0.20–1.20)
Total Protein: 5.7 g/dL — ABNORMAL LOW (ref 6.4–8.3)

## 2017-01-05 LAB — TECHNOLOGIST REVIEW

## 2017-01-05 LAB — MAGNESIUM: Magnesium: 2.1 mg/dl (ref 1.5–2.5)

## 2017-01-05 MED ORDER — SODIUM CHLORIDE 0.9 % IV SOLN
Freq: Once | INTRAVENOUS | Status: AC
  Administered 2017-01-05: 11:00:00 via INTRAVENOUS

## 2017-01-05 MED ORDER — IOPAMIDOL (ISOVUE-300) INJECTION 61%
INTRAVENOUS | Status: AC
  Filled 2017-01-05: qty 100

## 2017-01-05 MED ORDER — HEPARIN SOD (PORK) LOCK FLUSH 100 UNIT/ML IV SOLN
500.0000 [IU] | Freq: Once | INTRAVENOUS | Status: AC
  Administered 2017-01-05: 500 [IU] via INTRAVENOUS

## 2017-01-05 MED ORDER — IOPAMIDOL (ISOVUE-300) INJECTION 61%
100.0000 mL | Freq: Once | INTRAVENOUS | Status: AC | PRN
  Administered 2017-01-05: 100 mL via INTRAVENOUS

## 2017-01-05 MED ORDER — ONDANSETRON HCL 8 MG PO TABS
8.0000 mg | ORAL_TABLET | Freq: Once | ORAL | Status: AC
  Administered 2017-01-05: 8 mg via ORAL

## 2017-01-05 MED ORDER — SODIUM CHLORIDE 0.9% FLUSH
10.0000 mL | Freq: Once | INTRAVENOUS | Status: AC
  Administered 2017-01-05: 10 mL via INTRAVENOUS
  Filled 2017-01-05: qty 10

## 2017-01-05 MED ORDER — HEPARIN SOD (PORK) LOCK FLUSH 100 UNIT/ML IV SOLN
INTRAVENOUS | Status: AC
  Filled 2017-01-05: qty 5

## 2017-01-05 MED ORDER — ONDANSETRON HCL 8 MG PO TABS
ORAL_TABLET | ORAL | Status: AC
  Filled 2017-01-05: qty 1

## 2017-01-05 NOTE — Progress Notes (Signed)
Pt given po Zofran prior to starting to drink contrast for CT Scan this afternoon. Started 1st bottle @ 12:30 pm

## 2017-01-05 NOTE — Addendum Note (Signed)
Addended by: Aura Fey A on: 01/05/2017 03:17 PM   Modules accepted: Orders

## 2017-01-05 NOTE — Progress Notes (Signed)
Pt completed IV fluids. Pt drank 1 and 2/3 bottles of contrast prior to CT Scan. Port left accessed for scan. Transported via w/c to radiology. Pt's wife, son-in-law and daughter accompanied pt.  Pt to go home after scan is completed.

## 2017-01-05 NOTE — Progress Notes (Signed)
[facility] Progress Note   Cody Mckay [id number] 04-07-1932 83 y.o.  Cody Mckay is managed by Dr. Truitt Merle  Actively treated with chemotherapy: yes  Current Therapy: Abraxane and gemcitabine  Last Treated: Approximately December 17, 2016  Assessment: Plan:    Diarrhea, unspecified type - Plan: Culture, Stool, Fecal, C-Dff PCR, 0.9 %  sodium chloride infusion  Anorexia - Plan: 0.9 %  sodium chloride infusion  Nausea without vomiting - Plan: ondansetron (ZOFRAN) tablet 8 mg  Malignant neoplasm of head of pancreas (HCC)  Hypokalemia   Diarrhea, anorexia, nausea in the setting of a malignant neoplasm of the head of the pancreas: The patient is scheduled to have a CT scan with contrast of the abdomen and pelvis today.  He is scheduled to return to see Dr. Burr Medico on 01/17/2017 for consideration of chemotherapy.  Hypokalemia: Labs returned showing a potassium of 3.4 today.  No IV potassium will be given today.  The patient took at 20 mEq of oral potassium this morning and will repeat that dose tonight.  His labs will be rechecked on Friday.  Please see After Visit Summary for patient specific instructions.  Future Appointments  Date Time Provider [place]  01/05/2017  2:30 PM [facility] 2 [facility] [place]  01/07/2017 10:00 AM [facility] LAB 5 [facility] None  01/07/2017 10:15 AM [facility] B6 [facility] None  01/07/2017 10:45 AM Truitt Merle, MD [facility] None  01/07/2017 11:45 AM [facility] J32 DNS [facility] None  01/14/2017 10:45 AM [facility] LAB 3 [facility] None  01/14/2017 11:00 AM [facility] FLUSH NURSE 2 [facility] None  01/14/2017 11:30 AM Alla Feeling, NP [facility] None  01/14/2017 12:15 PM [facility] F20 [facility] None    Orders Placed This Encounter  Procedures  . Culture, Stool  . Fecal, C-Dff PCR       Subjective:   Patient ID:  Cody Mckay is a 83 y.o. (DOB Oct 09, 1932) male.  Chief Complaint: No chief complaint  on file.   HPI Cody Mckay is an 83 year old male with a diagnosis of a clinical stage IIb (cT2, cN1, cM0) pancreatic cancer.  He was seen yesterday and Monday for nausea, vomiting, and dehydration.  He was noted to have hypokalemia with a potassium of 2.8 on Monday and was given potassium 20 mEq IV.  His labs from Tuesday showed that his potassium level was lower at 88.  He was given 40 mEq of potassium IV and was given a total of 2 L of normal saline.  He presents to the clinic today prior to a scheduled CT scan of the abdomen and pelvis this afternoon.  He reports that he felt better last evening than he had felt in a long time.  He drank liquids and ate a small amount of solids.  His nausea and vomiting have resolved.  His diarrhea has abated and his abdominal pain is gone.  He is tentatively scheduled to have chemotherapy this Friday.  Medications: I have reviewed the patient's current medications.  Allergies:  Allergies  Allergen Reactions  . Codeine Other (See Comments)    Schizophrenic reactions.    Past Medical History:  Diagnosis Date  . Chicken pox   . Diabetes mellitus without complication ([place])   . Diverticulitis   . Gastric ulcer   . Hyperlipidemia   . Hypertension     Past Surgical History:  Procedure Laterality Date  . REPAIR OF PERFORATED ULCER    . TONSILLECTOMY     pt was 47 at time  of removal    Family History  Problem Relation Age of Onset  . Cancer Father        lung cancer  . Cancer Son        testicular cancer     Social History   Socioeconomic History  . Marital status: Married    Spouse name: Not on file  . Number of children: Not on file  . Years of education: Not on file  . Highest education level: Not on file  Social Needs  . Financial resource strain: Not on file  . Food insecurity - worry: Not on file  . Food insecurity - inability: Not on file  . Transportation needs - medical: Not on file  . Transportation needs - non-medical: Not  on file  Occupational History  . Not on file  Tobacco Use  . Smoking status: Never Smoker  . Smokeless tobacco: Never Used  Substance and Sexual Activity  . Alcohol use: No    Frequency: Never  . Drug use: No  . Sexual activity: Not on file  Other Topics Concern  . Not on file  Social History Narrative  . Not on file    Past Medical History, Surgical history, Social history, and Family history were reviewed and updated as appropriate.   Please see review of systems for further details on the patient's review from today.   Review of Systems:  Review of Systems  Constitutional: Positive for appetite change (The patient was able to eat a small amount of solids last evening and consumed liquids.) and fatigue. Negative for chills, diaphoresis and fever.  HENT: Negative for trouble swallowing.   Gastrointestinal: Negative for abdominal pain, constipation, diarrhea, nausea and vomiting.  Neurological: Positive for weakness.    Objective:   Physical Exam:  BP (!) 129/59 (BP Location: Left Arm, Patient Position: Sitting) Comment: nurse aware  Pulse (!) 53   Temp 97.6 F (36.4 C) (Oral)   Resp 18   Ht 5\' 10"  (1.778 m)   Wt 153 lb 8 oz (69.6 kg)   SpO2 100%   BMI 22.02 kg/m  ECOG: 2  Physical Exam  Constitutional: No distress.  The patient is an elderly chronically ill-appearing male who is supine in a recliner.  The physical exam was completed from the recliner.  HENT:  Head: Normocephalic.  Cardiovascular: Normal rate, regular rhythm and normal heart sounds. Exam reveals no gallop and no friction rub.  No murmur heard.   Pulmonary/Chest: Effort normal and breath sounds normal. No respiratory distress. He has no wheezes. He has no rales.  Abdominal: Soft. He exhibits no distension. Bowel sounds are decreased. There is no tenderness. There is no rebound and no guarding.  Musculoskeletal: He exhibits no edema.  Neurological: He is alert.  Skin: Skin is warm and dry. He is  not diaphoretic.    Lab Review:     Component Value Date/Time   NA 140 01/05/2017 1016   K 3.4 (L) 01/05/2017 1016   CO2 20 (L) 01/05/2017 1016   GLUCOSE 94 01/05/2017 1016   BUN 28.0 (H) 01/05/2017 1016   CREATININE 0.9 01/05/2017 1016   CALCIUM 8.9 01/05/2017 1016   PROT 5.7 (L) 01/05/2017 1016   ALBUMIN 2.4 (L) 01/05/2017 1016   AST 54 (H) 01/05/2017 1016   ALT 28 01/05/2017 1016   ALKPHOS 86 01/05/2017 1016   BILITOT 0.74 01/05/2017 1016       Component Value Date/Time   WBC 13.5 (H) 01/05/2017 1016  RBC 3.43 (L) 01/05/2017 1016   HGB 10.3 (L) 01/05/2017 1016   HCT 31.2 (L) 01/05/2017 1016   PLT 257 01/05/2017 1016   MCV 91.0 01/05/2017 1016   MCH 30.0 01/05/2017 1016   MCHC 33.0 01/05/2017 1016   RDW 15.4 (H) 01/05/2017 1016   LYMPHSABS 0.9 01/05/2017 1016   MONOABS 2.2 (H) 01/05/2017 1016   EOSABS 0.0 01/05/2017 1016   BASOSABS 0.0 01/05/2017 1016   -------------------------------  Imaging from last 24 hours (if applicable):  Radiology interpretation: Dg Chest 1 View  Result Date: 12/23/2016 CLINICAL DATA:  Port placement EXAM: CHEST  1 VIEW COMPARISON:  None. FINDINGS: The tip of the right jugular Port-A-Cath projects over the cavoatrial junction based on a single PA view. There is no lateral view of the chest for more accurate confirmation. No pneumothorax. No pleural effusion. Normal heart size. No consolidation or lung mass. IMPRESSION: Right jugular Port-A-Cath tip projects over the cavoatrial junction. No active cardiopulmonary disease. Electronically Signed   By: Marybelle Killings M.D.   On: 12/23/2016 11:07   Dg Chest 2 View  Result Date: 01/03/2017 CLINICAL DATA:  Generalize weakness for the past week with multiple episodes of vomiting over the past 3 days. Fell early this morning. Possible dehydration. EXAM: CHEST  2 VIEW COMPARISON:  Portable chest x-ray of December 22, 2016 FINDINGS: The lungs are mildly hyperinflated but clear. There is no pleural  effusion or pneumothorax. The heart and pulmonary vascularity are normal. The mediastinum is normal in width. There is multilevel degenerative disc disease of the thoracic spine. The power port catheter tip projects over the distal third of the SVC. IMPRESSION: Chronic bronchitic changes, stable. No pneumonia, CHF, nor other acute cardiopulmonary abnormality. Electronically Signed   By: David  Martinique M.D.   On: 01/03/2017 13:43   Dg Abd 2 Views  Result Date: 01/03/2017 CLINICAL DATA:  Lies weakness for the past week with multiple episodes of vomiting over the past 3 days. Patient ports falling in the bathtub early this morning. Possible dehydration. EXAM: ABDOMEN - 2 VIEW COMPARISON:  None in PACs FINDINGS: The lung bases are clear. The bowel gas pattern is normal. There are exuberant splenic artery calcifications. There is calcification in the wall of the abdominal aorta. There are phleboliths within the pelvis. The observed portions of the lumbar spine, pelvis, and hips exhibit no acute abnormalities. IMPRESSION: No acute intra-abdominal abnormality is observed. Electronically Signed   By: David  Martinique M.D.   On: 01/03/2017 13:41        This case was discussed with Dr. Burr Medico. She expressed agreement with my management of this patient.

## 2017-01-06 ENCOUNTER — Encounter (HOSPITAL_COMMUNITY): Payer: Self-pay | Admitting: Emergency Medicine

## 2017-01-06 ENCOUNTER — Telehealth: Payer: Self-pay | Admitting: *Deleted

## 2017-01-06 ENCOUNTER — Inpatient Hospital Stay (HOSPITAL_COMMUNITY)
Admission: EM | Admit: 2017-01-06 | Discharge: 2017-01-17 | DRG: 329 | Disposition: A | Discharge status: Home Health | Payer: Medicare Other | Attending: Internal Medicine | Admitting: Internal Medicine

## 2017-01-06 ENCOUNTER — Telehealth: Payer: Self-pay | Admitting: Adult Health

## 2017-01-06 DIAGNOSIS — C250 Malignant neoplasm of head of pancreas: Secondary | ICD-10-CM | POA: Diagnosis present

## 2017-01-06 DIAGNOSIS — I10 Essential (primary) hypertension: Secondary | ICD-10-CM | POA: Diagnosis present

## 2017-01-06 DIAGNOSIS — K219 Gastro-esophageal reflux disease without esophagitis: Secondary | ICD-10-CM | POA: Diagnosis present

## 2017-01-06 DIAGNOSIS — C801 Malignant (primary) neoplasm, unspecified: Secondary | ICD-10-CM

## 2017-01-06 DIAGNOSIS — Z7189 Other specified counseling: Secondary | ICD-10-CM | POA: Diagnosis not present

## 2017-01-06 DIAGNOSIS — C784 Secondary malignant neoplasm of small intestine: Secondary | ICD-10-CM | POA: Diagnosis present

## 2017-01-06 DIAGNOSIS — R001 Bradycardia, unspecified: Secondary | ICD-10-CM

## 2017-01-06 DIAGNOSIS — R531 Weakness: Secondary | ICD-10-CM | POA: Diagnosis not present

## 2017-01-06 DIAGNOSIS — Z9221 Personal history of antineoplastic chemotherapy: Secondary | ICD-10-CM

## 2017-01-06 DIAGNOSIS — R42 Dizziness and giddiness: Secondary | ICD-10-CM

## 2017-01-06 DIAGNOSIS — Z794 Long term (current) use of insulin: Secondary | ICD-10-CM | POA: Diagnosis not present

## 2017-01-06 DIAGNOSIS — E785 Hyperlipidemia, unspecified: Secondary | ICD-10-CM | POA: Diagnosis present

## 2017-01-06 DIAGNOSIS — R197 Diarrhea, unspecified: Secondary | ICD-10-CM | POA: Diagnosis not present

## 2017-01-06 DIAGNOSIS — R112 Nausea with vomiting, unspecified: Secondary | ICD-10-CM | POA: Diagnosis present

## 2017-01-06 DIAGNOSIS — E876 Hypokalemia: Secondary | ICD-10-CM | POA: Diagnosis not present

## 2017-01-06 DIAGNOSIS — K9413 Enterostomy malfunction: Secondary | ICD-10-CM

## 2017-01-06 DIAGNOSIS — Z79899 Other long term (current) drug therapy: Secondary | ICD-10-CM

## 2017-01-06 DIAGNOSIS — E43 Unspecified severe protein-calorie malnutrition: Secondary | ICD-10-CM | POA: Diagnosis present

## 2017-01-06 DIAGNOSIS — L8990 Pressure ulcer of unspecified site, unspecified stage: Secondary | ICD-10-CM | POA: Diagnosis present

## 2017-01-06 DIAGNOSIS — Z6820 Body mass index (BMI) 20.0-20.9, adult: Secondary | ICD-10-CM

## 2017-01-06 DIAGNOSIS — Z66 Do not resuscitate: Secondary | ICD-10-CM | POA: Diagnosis present

## 2017-01-06 DIAGNOSIS — K315 Obstruction of duodenum: Principal | ICD-10-CM | POA: Diagnosis present

## 2017-01-06 DIAGNOSIS — D72829 Elevated white blood cell count, unspecified: Secondary | ICD-10-CM | POA: Diagnosis present

## 2017-01-06 DIAGNOSIS — Z885 Allergy status to narcotic agent status: Secondary | ICD-10-CM | POA: Diagnosis not present

## 2017-01-06 DIAGNOSIS — I452 Bifascicular block: Secondary | ICD-10-CM | POA: Diagnosis present

## 2017-01-06 DIAGNOSIS — Z515 Encounter for palliative care: Secondary | ICD-10-CM

## 2017-01-06 DIAGNOSIS — C259 Malignant neoplasm of pancreas, unspecified: Secondary | ICD-10-CM

## 2017-01-06 DIAGNOSIS — E860 Dehydration: Secondary | ICD-10-CM | POA: Diagnosis present

## 2017-01-06 DIAGNOSIS — Z6822 Body mass index (BMI) 22.0-22.9, adult: Secondary | ICD-10-CM

## 2017-01-06 DIAGNOSIS — E119 Type 2 diabetes mellitus without complications: Secondary | ICD-10-CM | POA: Diagnosis present

## 2017-01-06 DIAGNOSIS — R9431 Abnormal electrocardiogram [ECG] [EKG]: Secondary | ICD-10-CM | POA: Diagnosis not present

## 2017-01-06 DIAGNOSIS — Z931 Gastrostomy status: Secondary | ICD-10-CM | POA: Diagnosis not present

## 2017-01-06 DIAGNOSIS — I951 Orthostatic hypotension: Secondary | ICD-10-CM | POA: Diagnosis present

## 2017-01-06 LAB — CBC WITH DIFFERENTIAL/PLATELET
BASOS ABS: 0 10*3/uL (ref 0.0–0.1)
Basophils Relative: 0 %
Eosinophils Absolute: 0.1 10*3/uL (ref 0.0–0.7)
Eosinophils Relative: 0 %
HCT: 29 % — ABNORMAL LOW (ref 39.0–52.0)
HEMOGLOBIN: 9.8 g/dL — AB (ref 13.0–17.0)
LYMPHS PCT: 9 %
Lymphs Abs: 1.4 10*3/uL (ref 0.7–4.0)
MCH: 30.2 pg (ref 26.0–34.0)
MCHC: 33.8 g/dL (ref 30.0–36.0)
MCV: 89.5 fL (ref 78.0–100.0)
MONO ABS: 2.2 10*3/uL — AB (ref 0.1–1.0)
MONOS PCT: 15 %
NEUTROS ABS: 11.3 10*3/uL — AB (ref 1.7–7.7)
NEUTROS PCT: 76 %
Platelets: 274 10*3/uL (ref 150–400)
RBC: 3.24 MIL/uL — ABNORMAL LOW (ref 4.22–5.81)
RDW: 15.3 % (ref 11.5–15.5)
WBC: 14.9 10*3/uL — ABNORMAL HIGH (ref 4.0–10.5)

## 2017-01-06 LAB — I-STAT CHEM 8, ED
BUN: 16 mg/dL (ref 6–20)
CREATININE: 0.7 mg/dL (ref 0.61–1.24)
Calcium, Ion: 1.16 mmol/L (ref 1.15–1.40)
Chloride: 105 mmol/L (ref 101–111)
GLUCOSE: 58 mg/dL — AB (ref 65–99)
HEMATOCRIT: 27 % — AB (ref 39.0–52.0)
HEMOGLOBIN: 9.2 g/dL — AB (ref 13.0–17.0)
Potassium: 3.5 mmol/L (ref 3.5–5.1)
Sodium: 139 mmol/L (ref 135–145)
TCO2: 22 mmol/L (ref 22–32)

## 2017-01-06 LAB — COMPREHENSIVE METABOLIC PANEL
ALK PHOS: 67 U/L (ref 38–126)
ALT: 43 U/L (ref 17–63)
ANION GAP: 6 (ref 5–15)
AST: 70 U/L — AB (ref 15–41)
Albumin: 2.3 g/dL — ABNORMAL LOW (ref 3.5–5.0)
BILIRUBIN TOTAL: 0.8 mg/dL (ref 0.3–1.2)
BUN: 19 mg/dL (ref 6–20)
CALCIUM: 8.2 mg/dL — AB (ref 8.9–10.3)
CO2: 22 mmol/L (ref 22–32)
CREATININE: 0.71 mg/dL (ref 0.61–1.24)
Chloride: 108 mmol/L (ref 101–111)
GFR calc Af Amer: >60 mL/min (ref >60)
GLUCOSE: 60 mg/dL — AB (ref 65–99)
Potassium: 3.4 mmol/L — ABNORMAL LOW (ref 3.5–5.1)
Sodium: 136 mmol/L (ref 135–145)
TOTAL PROTEIN: 5.3 g/dL — AB (ref 6.5–8.1)

## 2017-01-06 LAB — I-STAT TROPONIN, ED: Troponin i, poc: 0.07 ng/mL (ref 0.00–0.08)

## 2017-01-06 LAB — URINALYSIS, ROUTINE W REFLEX MICROSCOPIC
BILIRUBIN URINE: NEGATIVE
GLUCOSE, UA: NEGATIVE mg/dL
HGB URINE DIPSTICK: NEGATIVE
KETONES UR: NEGATIVE mg/dL
Leukocytes, UA: NEGATIVE
Nitrite: NEGATIVE
PH: 5 (ref 5.0–8.0)
Protein, ur: NEGATIVE mg/dL
SPECIFIC GRAVITY, URINE: 1.027 (ref 1.005–1.030)

## 2017-01-06 LAB — GLUCOSE, CAPILLARY
GLUCOSE-CAPILLARY: 92 mg/dL (ref 65–99)
Glucose-Capillary: 67 mg/dL (ref 65–99)

## 2017-01-06 LAB — MAGNESIUM: MAGNESIUM: 1.8 mg/dL (ref 1.7–2.4)

## 2017-01-06 LAB — CBG MONITORING, ED: Glucose-Capillary: 57 mg/dL — ABNORMAL LOW (ref 65–99)

## 2017-01-06 LAB — LIPASE, BLOOD: Lipase: 35 U/L (ref 11–51)

## 2017-01-06 MED ORDER — KCL IN DEXTROSE-NACL 20-5-0.45 MEQ/L-%-% IV SOLN
INTRAVENOUS | Status: DC
  Administered 2017-01-06 – 2017-01-07 (×3): via INTRAVENOUS
  Filled 2017-01-06 (×5): qty 1000

## 2017-01-06 MED ORDER — ENOXAPARIN SODIUM 40 MG/0.4ML ~~LOC~~ SOLN
40.0000 mg | SUBCUTANEOUS | Status: DC
  Administered 2017-01-07 – 2017-01-09 (×3): 40 mg via SUBCUTANEOUS
  Filled 2017-01-06 (×3): qty 0.4

## 2017-01-06 MED ORDER — SODIUM CHLORIDE 0.9 % IV BOLUS (SEPSIS)
1000.0000 mL | Freq: Once | INTRAVENOUS | Status: AC
  Administered 2017-01-06: 1000 mL via INTRAVENOUS

## 2017-01-06 MED ORDER — NIACIN ER 250 MG PO CPCR
750.0000 mg | ORAL_CAPSULE | Freq: Two times a day (BID) | ORAL | Status: DC
  Administered 2017-01-07 – 2017-01-09 (×2): 750 mg via ORAL
  Filled 2017-01-06 (×6): qty 3

## 2017-01-06 MED ORDER — SODIUM CHLORIDE 0.9 % IV SOLN
Freq: Once | INTRAVENOUS | Status: AC
  Administered 2017-01-06: 19:00:00 via INTRAVENOUS

## 2017-01-06 MED ORDER — INSULIN ASPART 100 UNIT/ML ~~LOC~~ SOLN
0.0000 [IU] | Freq: Three times a day (TID) | SUBCUTANEOUS | Status: DC
  Administered 2017-01-07 – 2017-01-08 (×3): 3 [IU] via SUBCUTANEOUS
  Administered 2017-01-08: 5 [IU] via SUBCUTANEOUS
  Administered 2017-01-08 – 2017-01-09 (×4): 2 [IU] via SUBCUTANEOUS
  Administered 2017-01-10 (×2): 3 [IU] via SUBCUTANEOUS
  Administered 2017-01-11: 2 [IU] via SUBCUTANEOUS
  Administered 2017-01-11: 3 [IU] via SUBCUTANEOUS

## 2017-01-06 MED ORDER — ACETAMINOPHEN 325 MG PO TABS: 650.0000 mg | ORAL_TABLET | Freq: Four times a day (QID) | ORAL | Status: DC | PRN

## 2017-01-06 MED ORDER — ACETAMINOPHEN 650 MG RE SUPP
650.0000 mg | Freq: Four times a day (QID) | RECTAL | Status: DC | PRN
  Administered 2017-01-10: 650 mg via RECTAL
  Filled 2017-01-06: qty 1

## 2017-01-06 MED ORDER — ATORVASTATIN CALCIUM 10 MG PO TABS
5.0000 mg | ORAL_TABLET | Freq: Every day | ORAL | Status: DC
  Administered 2017-01-07 – 2017-01-08 (×2): 5 mg via ORAL
  Filled 2017-01-06 (×2): qty 1

## 2017-01-06 MED ORDER — CALCIUM CARBONATE-VITAMIN D 500-200 MG-UNIT PO TABS
ORAL_TABLET | Freq: Every day | ORAL | Status: DC
  Administered 2017-01-07: 1 via ORAL
  Filled 2017-01-06 (×4): qty 1

## 2017-01-06 MED ORDER — PANTOPRAZOLE SODIUM 40 MG PO TBEC
40.0000 mg | DELAYED_RELEASE_TABLET | Freq: Every day | ORAL | Status: DC
  Administered 2017-01-07 – 2017-01-09 (×3): 40 mg via ORAL
  Filled 2017-01-06 (×3): qty 1

## 2017-01-06 MED ORDER — VITAMIN B-6 100 MG PO TABS
100.0000 mg | ORAL_TABLET | Freq: Every day | ORAL | Status: DC
  Administered 2017-01-07 – 2017-01-09 (×3): 100 mg via ORAL
  Filled 2017-01-06 (×6): qty 1

## 2017-01-06 MED ORDER — TRAZODONE HCL 50 MG PO TABS: 25.0000 mg | ORAL_TABLET | Freq: Every evening | ORAL | Status: DC | PRN

## 2017-01-06 MED ORDER — HYDROCODONE-ACETAMINOPHEN 5-325 MG PO TABS
1.0000 | ORAL_TABLET | ORAL | Status: DC | PRN
  Administered 2017-01-11: 2 via ORAL
  Administered 2017-01-11: 1 via ORAL
  Filled 2017-01-06: qty 1
  Filled 2017-01-06: qty 2

## 2017-01-06 MED ORDER — INSULIN ASPART 100 UNIT/ML ~~LOC~~ SOLN
0.0000 [IU] | Freq: Every day | SUBCUTANEOUS | Status: DC
Start: 2017-01-06 — End: 2017-01-11

## 2017-01-06 MED ORDER — SENNOSIDES-DOCUSATE SODIUM 8.6-50 MG PO TABS: 1.0000 | ORAL_TABLET | Freq: Every evening | ORAL | Status: DC | PRN

## 2017-01-06 NOTE — Telephone Encounter (Signed)
I anticipate he will be admitted, will see him in [facility] hospital tomorrow. His recent CT scan showed his pancreatic cancer invading duodenium which causing obstruction and N/V. I have talked to GI Dr. Ardis Hughs and he does not think endoscopic stenting is an option.May need surgical consult for J-TUBE. May discuss hospice with pt and his family also. Thanks   Truitt Merle MD

## 2017-01-06 NOTE — Telephone Encounter (Signed)
Copied from [place] [phone]. Topic: Quick Communication - Rx Refill/Question >> Jan 06, 2017 10:45 AM Carolyn Stare wrote: Has the patient contacted their pharmacy yes  [facility]   Lancets [facility] ULTRASOFT) lancets        Agent: Please be advised that RX refills may take up to 3 business days. We ask that you follow-up with your pharmacy.

## 2017-01-06 NOTE — ED Provider Notes (Signed)
[facility] DEPT Provider Note   CSN: [id number] Arrival date & time: 01/06/17  1605     History   Chief Complaint Chief Complaint  Patient presents with  . Bradycardia  . Generalized Body Aches    HPI Cody Mckay is a 83 y.o. male.  HPI Cody Mckay is a 83 y.o. male with history of diabetes, pancreatic cancer, hypertension, currently on chemotherapy, presents to emergency department complaining of dizziness and weakness.  Patient has been recently followed by his oncologist, Dr. Lysle Rubens, and states that he has had some nausea and abdominal pain with eating that has gotten worse in the last several days.  He has been in and out of the office receiving IV fluids and potassium.  He received fluids 2 days ago and again yesterday.  He had a CT scan of abdomen pelvis done yesterday which showed that his pancreatic cancer is invading duodenum, and could be why it is causing his symptoms.  Patient states that today he was able to eat scrambled eggs for breakfast with no difficulty.  He does admit to not eating or drinking as much because he is worried he is going to be nauseated.  He states his home health nurse noticed that his heart rate was low today.  Patient's heart rate in the 40s at home.  He denies history of the same.  He states his normal heart rate is usually between 60 and 90.  Blood pressure has been normal.  Patient denies fever or chills.  He denies any pain in his chest or abdomen.  He denies any prior cardiac history.  Denies any swelling in extremities.  No other complaints.  Patient called his oncologist and was told to come here.    Past Medical History:  Diagnosis Date  . Chicken pox   . Diabetes mellitus without complication ([place])   . Diverticulitis   . Gastric ulcer   . Hyperlipidemia   . Hypertension     Patient Active Problem List   Diagnosis Date Noted  . DM (diabetes mellitus) ([place]) 01/04/2017  . Hyperlipidemia 01/04/2017  .  Goals of care, counseling/discussion 12/23/2016  . Pancreatic cancer ([place]) 12/22/2016    Past Surgical History:  Procedure Laterality Date  . REPAIR OF PERFORATED ULCER    . TONSILLECTOMY     pt was 47 at time of removal       Home Medications    Prior to Admission medications   Medication Sig Start Date End Date Taking? Authorizing Provider  acetaminophen (TYLENOL) 500 MG tablet Take 500 mg by mouth every 6 (six) hours as needed.    [provider]  alfuzosin (UROXATRAL) 10 MG 24 hr tablet  11/18/16   [provider]  amLODipine (NORVASC) 5 MG tablet  10/21/16   [provider]  atorvastatin (LIPITOR) 10 MG tablet  11/24/16   [provider]  Calcium-Magnesium-Vitamin D (CALCIUM 500 PO) Take 1 tablet by mouth daily.    [provider]  diphenoxylate-atropine (LOMOTIL) 2.5-0.025 MG tablet  12/17/16   [provider]  esomeprazole (NEXIUM) 40 MG capsule Take 2 capsules by mouth daily. 11/29/16   [provider]  glucose blood (ONETOUCH VERIO) test strip Use as instructed 01/04/17   Nafziger, Tommi Rumps, NP  insulin aspart (NOVOLOG) 100 UNIT/ML injection Inject 5 Units into the skin 3 (three) times daily before meals.    [provider]  Insulin Syringe-Needle U-100 (INSULIN SYRINGE 1CC/30GX5/16") 30G X 5/16" 1  ML MISC . 01/04/17   Nafziger, Tommi Rumps, NP  Lancets [facility] ULTRASOFT) lancets Use as instructed 01/04/17   Nafziger, Tommi Rumps, NP  LANTUS SOLOSTAR 100 UNIT/ML Solostar Pen Inject 15 Units into the skin daily. 11/29/16   [provider]  lidocaine-prilocaine (EMLA) cream Apply 1 application topically as needed. 12/22/16   Truitt Merle, MD  NIASPAN 750 MG CR tablet Take 750 mg by mouth 2 (two) times daily.  10/06/16   [provider]  ondansetron (ZOFRAN) 8 MG tablet  12/03/16   [provider]  potassium chloride SA (K-DUR,KLOR-CON) 20 MEQ tablet Take 60 mEq by mouth daily.  12/14/16   [provider]  prochlorperazine (COMPAZINE) 10 MG tablet as needed. 12/03/16   [provider]  triamcinolone ointment (KENALOG) 0.1 % as needed. 11/22/16   [provider]    Family History Family History  Problem Relation Age of Onset  . Cancer Father        lung cancer  . Cancer Son        testicular cancer     Social History Social History   Tobacco Use  . Smoking status: Never Smoker  . Smokeless tobacco: Never Used  Substance Use Topics  . Alcohol use: No    Frequency: Never  . Drug use: No     Allergies   Codeine   Review of Systems Review of Systems  Constitutional: Positive for fatigue. Negative for chills and fever.  Respiratory: Negative for cough, chest tightness and shortness of breath.   Cardiovascular: Negative for chest pain, palpitations and leg swelling.  Gastrointestinal: Positive for nausea and vomiting. Negative for abdominal distention, abdominal pain and diarrhea.  Genitourinary: Negative for dysuria, frequency, hematuria and urgency.  Musculoskeletal: Positive for arthralgias and myalgias. Negative for neck pain and neck stiffness.  Skin: Negative for rash.  Allergic/Immunologic: Negative for immunocompromised state.  Neurological: Positive for dizziness, weakness and light-headedness. Negative for numbness and headaches.  All other systems reviewed and are negative.    Physical Exam Updated Vital Signs BP (!) 115/55 (BP Location: Left Arm)   Pulse (!) 46   Temp 97.9 F (36.6 C) (Oral)   SpO2 99%   Physical Exam  Constitutional: He appears well-developed and well-nourished. No distress.  HENT:  Head: Normocephalic and atraumatic.  Eyes: Conjunctivae are normal.  Neck: Neck supple.  Cardiovascular: Regular rhythm and normal heart sounds.  bradycardic  Pulmonary/Chest: Effort normal. No respiratory distress. He has no wheezes. He has no rales.  Abdominal: Soft. Bowel sounds are normal. He exhibits no distension.  There is no tenderness. There is no rebound.  Musculoskeletal: He exhibits no edema.  Neurological: He is alert.  Skin: Skin is warm and dry.  Nursing note and vitals reviewed.    ED Treatments / Results  Labs (all labs ordered are listed, but only abnormal results are displayed) Labs Reviewed  CBC WITH DIFFERENTIAL/PLATELET - Abnormal; Notable for the following components:      Result Value   WBC 14.9 (*)    RBC 3.24 (*)    Hemoglobin 9.8 (*)    HCT 29.0 (*)    Neutro Abs 11.3 (*)    Monocytes Absolute 2.2 (*)    All other components within normal limits  COMPREHENSIVE METABOLIC PANEL - Abnormal; Notable for the following components:   Potassium 3.4 (*)    Glucose, Bld 60 (*)    Calcium 8.2 (*)    Total Protein 5.3 (*)    Albumin  2.3 (*)    AST 70 (*)    All other components within normal limits  URINALYSIS, ROUTINE W REFLEX MICROSCOPIC - Abnormal; Notable for the following components:   APPearance HAZY (*)    All other components within normal limits  CBG MONITORING, ED - Abnormal; Notable for the following components:   Glucose-Capillary 57 (*)    All other components within normal limits  I-STAT CHEM 8, ED - Abnormal; Notable for the following components:   Glucose, Bld 58 (*)    Hemoglobin 9.2 (*)    HCT 27.0 (*)    All other components within normal limits  MAGNESIUM  LIPASE, BLOOD  GLUCOSE, CAPILLARY  GLUCOSE, CAPILLARY  COMPREHENSIVE METABOLIC PANEL  CBC  PROTIME-INR  APTT  I-STAT TROPONIN, ED    EKG  EKG Interpretation  Date/Time:  Thursday January 06 2017 16:14:27 EST Ventricular Rate:  51 PR Interval:    QRS Duration: 154 QT Interval:  508 QTC Calculation: 468 R Axis:   -63 Text Interpretation:  Sinus rhythm RBBB and LAFB Artifact no prior available for comparison Confirmed by Quintella Reichert [phone]) on 01/06/2017 5:00:49 PM       Radiology Ct Abdomen Pelvis W Contrast  Result Date: 01/05/2017 CLINICAL DATA:  83 year old male with  history of pancreatic carcinoma, with diagnosis [place]. Given history of oncology with Dr. Truitt Merle, and CT from outside institutions. EXAM: CT ABDOMEN AND PELVIS WITH CONTRAST TECHNIQUE: Multidetector CT imaging of the abdomen and pelvis was performed using the standard protocol following bolus administration of intravenous contrast. CONTRAST:  162mL ISOVUE-300 IOPAMIDOL (ISOVUE-300) INJECTION 61% COMPARISON:  No comparison available FINDINGS: Lower chest: No acute abnormality. Hepatobiliary: Relatively uniform attenuation of the liver parenchyma. There is a small hypodense focus adjacent to the falciform ligament which measures 12 mm on axial CT images, best seen on the delayed images. Unremarkable appearance of gallbladder. Mild dilation of the proximal intrahepatic biliary ducts and the common bile duct. The common bile duct abruptly terminates within the head of the pancreas, near the cut off sign of the pancreatic duct at the pancreatic head. Pancreas: Hypoenhancing mass involving the uncinate process and pancreatic head. The axial measurements estimate 5.1 cm x 3.6 cm, just anterior to the transverse duodenum. The mass is inseparable from the proximal superior mesenteric artery, with obliteration of the fat planes. The inferior/ posterior pancreatic mass inseparable from the duodenum. The right aspects of the mass extend towards the pancreatic head where there is a cut off of the pancreatic duct and the common bile duct. Distal pancreatic duct is dilated, measuring as low grade as 4 mm in diameter. No comparison CT imaging available. Small lymph nodes in the gastro pathic ligament. Borderline enlarged portacaval node measuring 8 mm. Rounded soft tissue nodule measures 11 mm just inferior to the pancreatic neck. Portal vein, splenic vein, superior mesenteric vein, and inferior mesenteric vein patent on the study. Spleen: Unremarkable spleen Adrenals/Urinary Tract: Unremarkable adrenal glands. Bilateral  kidneys unremarkable with hydronephrosis. Small nonobstructive stone at the superior collecting system of the right kidney. Unremarkable course the visualized bilateral ureters. Urinary bladder with demonstrate circumferential thickening. Stomach/Bowel: Unremarkable stomach. Duodenum is attenuated at the transverse portion, which is inseparable from pancreatic mass. Proximal small bowel unremarkable with no distention. Enteric contrast reaches distal small bowel. Normal appendix. Colonic diverticula. No evidence of associated inflammatory changes. Vascular/Lymphatic: Vascular calcifications of the aorta extending into the bilateral iliac system. Bilateral iliac and proximal femoral vasculature patent. Calcifications of the proximal bilateral  renal arteries, with likely 50% stenosis bilaterally. Vascular calcifications at the origin of the celiac artery, superior mesenteric artery, and inferior mesenteric artery. Reproductive: Transverse diameter of the prostate estimated at 6.2 cm. Other: Fat containing umbilical hernia Musculoskeletal: No acute displaced fracture. Degenerative changes of the lumbar spine. IMPRESSION: Hypoenhancing mass of the uncinate process, compatible with adenocarcinoma. CT is positive for vascular involvement of the proximal superior mesenteric artery at the posterior margin. Comparison with outside CT imaging may be useful. Pancreatic mass is inseparable from the transverse duodenum, with narrowing of the lumen of the transverse duodenum. At least stenosis if not occlusion of the pancreatic duct, with questionable obstruction of the common bile duct. There is associated mild intrahepatic biliary ductal dilatation. Again, comparison with outside CT imaging may be useful to evaluate for progression. Question of small metastatic implant of the left liver adjacent to falciform ligament. Alternatively this could represent focal fat. MRI may be considered, if warranted. Aortic atherosclerosis,  mesenteric atherosclerosis, and bilateral renal artery atherosclerosis. Aortic Atherosclerosis (ICD10-I70.0). Electronically Signed   By: Corrie Mckusick D.O.   On: 01/05/2017 16:10    Procedures Procedures (including critical care time)  Medications Ordered in ED Medications  sodium chloride 0.9 % bolus 1,000 mL (not administered)     Initial Impression / Assessment and Plan / ED Course  I have reviewed the triage vital signs and the nursing notes.  Pertinent labs & imaging results that were available during my care of the patient were reviewed by me and considered in my medical decision making (see chart for details).     Pt seen and examined. Pt with hx of pancreatic ca with metastasis into duodenum, has been in and out of office for dehydration, hypokalemia, hypotension. Based on notes, looks like pt was going to be admitted few days ago but there was no beds in the hospital and long wait in ER. Pt seems not to be improving. There is a note from Dr.Feng, who belive that pt may need J tube and possible endoscoping stenting for his obstruction in duadenum. Pt most likely will need to be admtited. ECG showing HR of 40, sinus rhythm. Will monitor. Check electrolytes. Administer fluids.   6:52 PM Electrolytes with no acute abnormalities.  His glucose is low, was given some sugary drinks.  Potassium 3.5 today.  I discussed patient with hospitalist, they will admit for symptomatic bradycardia, generalized weakness, decreased p.o. intake.  Also discussed with Dr. Jana Hakim with oncology who is aware the patient is going to be admitted to the hospital.  Vitals:   01/06/17 1615 01/06/17 1730 01/06/17 1800  BP: (!) 115/55 (!) 150/62 138/63  Pulse: (!) 46 (!) 47 (!) 42  Resp:  17 19  Temp: 97.9 F (36.6 C)    TempSrc: Oral    SpO2: 99% 97% 95%     Final Clinical Impressions(s) / ED Diagnoses   Final diagnoses:  Bradycardia  Generalized weakness    ED Discharge Orders    None         Janee Morn 01/06/17 2338    Quintella Reichert, MD 01/09/17 [phone]

## 2017-01-06 NOTE — Telephone Encounter (Signed)
TCT patient at request of Dr. Burr Medico. Spoke with patient and advised him to stay on liquid diet for now. Pt understands that the tumor is pressing on his duodenum and that is why he get nauseated at times. Pt voiced understanding on liquid diet and that he has an appt with Dr. Burr Medico tomorrow. Pt states he feels fair today.

## 2017-01-06 NOTE — Progress Notes (Signed)
These preliminary result these preliminary results were noted.  Awaiting final report.

## 2017-01-06 NOTE — H&P (Signed)
History and Physical    Cody Mckay WUJ:[id number] DOB: 02/19/1932 DOA: 01/06/2017  PCP: Patient, No Pcp Per Patient coming from: Home  Chief Complaint: dizziness  HPI: Cody Mckay is a 83 y.o. male with medical history significant of adenocarcinoma of the pancreas on chemotherapy, diabetes type 2, hypertension, hyperlipidemia, GERD comes to the hospital for evaluation of dizziness.  Patient states he was diagnosed of pancreatic cancer about 1-2 months ago in [place].  He recently moved here and has been following with Dr. Annamaria Boots.  He is currently on chemo therapy Gemcitabine and Abraxane (3weeks on one week off), but in past few days he has had very poor appetite and fluid intake.  Due to this he has been to ER and Dr. Fritz Pickerel office almost every day for last 3 days for IV fluids and potassium repletion.  He comes to the ER today with the same complaints and has been feeling dizzy at home as well. In the ER today he appears clinically dehydrated with poor desire to eat.  We will admit him for further care.  Patient comprehends really well and his family is at bedside as well.  I have discussed it at great length that patient's options are limited given the nature of pancreatic cancer.  Patient is desires to be DO NOT RESUSCITATE but would like to speak with Dr Annamaria Boots and the morning about his long term prognosis from his cancer perspective, feeding options including J tube and are open to speaking with Palliative care services to establish long term goals of care.    Review of Systems: As per HPI otherwise 10 point review of systems negative.   Past Medical History:  Diagnosis Date  . Chicken pox   . Diabetes mellitus without complication ([place])   . Diverticulitis   . Gastric ulcer   . Hyperlipidemia   . Hypertension     Past Surgical History:  Procedure Laterality Date  . REPAIR OF PERFORATED ULCER    . TONSILLECTOMY     pt was 47 at time of removal     reports that  has  never smoked. he has never used smokeless tobacco. He reports that he does not drink alcohol or use drugs.  Allergies  Allergen Reactions  . Codeine Other (See Comments)    Schizophrenic reactions.    Family History  Problem Relation Age of Onset  . Cancer Father        lung cancer  . Cancer Son        testicular cancer     Acceptable: Family history reviewed and not pertinent (If you reviewed it)  Prior to Admission medications   Medication Sig Start Date End Date Taking? Authorizing Provider  acetaminophen (TYLENOL) 500 MG tablet Take 500 mg by mouth every 6 (six) hours as needed.   Yes [provider]  alfuzosin (UROXATRAL) 10 MG 24 hr tablet  11/18/16  Yes [provider]  atorvastatin (LIPITOR) 10 MG tablet Take 5 mg by mouth daily at 6 PM.  11/24/16  Yes [provider]  Calcium-Magnesium-Vitamin D (CALCIUM 500 PO) Take 1 tablet by mouth daily.   Yes [provider]  diphenoxylate-atropine (LOMOTIL) 2.5-0.025 MG tablet  12/17/16  Yes [provider]  esomeprazole (NEXIUM) 40 MG capsule Take 2 capsules by mouth daily. 11/29/16  Yes [provider]  insulin aspart (NOVOLOG) 100 UNIT/ML injection Inject 5 Units into the skin 3 (three) times daily before meals.   Yes [provider]  LANTUS SOLOSTAR 100 UNIT/ML Solostar Pen Inject 15 Units into the skin daily. 11/29/16  Yes [provider]  NIASPAN 750 MG CR tablet Take 750 mg by mouth 2 (two) times daily.  10/06/16  Yes [provider]  ondansetron (ZOFRAN) 8 MG tablet  12/03/16  Yes [provider]  potassium chloride SA (K-DUR,KLOR-CON) 20 MEQ tablet Take 60 mEq by mouth daily.  12/14/16  Yes [provider]  prochlorperazine (COMPAZINE) 10 MG tablet as needed. 12/03/16  Yes [provider]  pyridOXINE (VITAMIN B-6) 100 MG tablet Take 100 mg by mouth daily.   Yes [provider]  triamcinolone ointment (KENALOG) 0.1 %  as needed. 11/22/16  Yes [provider]  amLODipine (NORVASC) 5 MG tablet  10/21/16   [provider]  glucose blood (ONETOUCH VERIO) test strip Use as instructed 01/04/17   Nafziger, Tommi Rumps, NP  Insulin Syringe-Needle U-100 (INSULIN SYRINGE 1CC/30GX5/16") 30G X 5/16" 1 ML MISC . 01/04/17   Nafziger, Tommi Rumps, NP  Lancets [facility] ULTRASOFT) lancets Use as instructed 01/04/17   Dorothyann Peng, NP  lidocaine-prilocaine (EMLA) cream Apply 1 application topically as needed. 12/22/16   Truitt Merle, MD    Physical Exam: Vitals:   01/06/17 1615 01/06/17 1730 01/06/17 1800 01/06/17 1830  BP: (!) 115/55 (!) 150/62 138/63 136/78  Pulse: (!) 46 (!) 47 (!) 42 (!) 53  Resp:  17 19 (!) 22  Temp: 97.9 F (36.6 C)     TempSrc: Oral     SpO2: 99% 97% 95% 97%      Constitutional: NAD, calm, comfortable; elderly frail-appearing, temporal wasting Vitals:   01/06/17 1615 01/06/17 1730 01/06/17 1800 01/06/17 1830  BP: (!) 115/55 (!) 150/62 138/63 136/78  Pulse: (!) 46 (!) 47 (!) 42 (!) 53  Resp:  17 19 (!) 22  Temp: 97.9 F (36.6 C)     TempSrc: Oral     SpO2: 99% 97% 95% 97%   Eyes: PERRL, lids and conjunctivae normal ENMT: Mucous membranes are DRY. Posterior pharynx clear of any exudate or lesions.Normal dentition.  Neck: normal, supple, no masses, no thyromegaly Respiratory: clear to auscultation bilaterally, no wheezing, no crackles. Normal respiratory effort. No accessory muscle use.  Cardiovascular: Regular rate and rhythm, no murmurs / rubs / gallops. No extremity edema. 2+ pedal pulses. No carotid bruits.  Abdomen: no tenderness, no masses palpated. No hepatosplenomegaly. Bowel sounds positive.  Musculoskeletal: no clubbing / cyanosis. No joint deformity upper and lower extremities. Good ROM, no contractures. Normal muscle tone.  Skin: no rashes, lesions, ulcers. No induration Neurologic: CN 2-12 grossly intact. Sensation intact, DTR normal. Strength 5/5 in all 4.  Psychiatric:  Normal judgment and insight. Alert and oriented x 3. Normal mood.     Labs on Admission: I have personally reviewed following labs and imaging studies  CBC: Recent Labs  Lab 01/03/17 1037 01/05/17 1016 01/06/17 1739 01/06/17 1746  WBC 15.9* 13.5* 14.9*  --   NEUTROABS 12.5* 10.4* 11.3*  --   HGB 11.5* 10.3* 9.8* 9.2*  HCT 34.6* 31.2* 29.0* 27.0*  MCV 89.9 91.0 89.5  --   PLT 326 257 274  --    Basic Metabolic Panel: Recent Labs  Lab 01/03/17 1037 01/04/17 1157 01/05/17 1016 01/06/17 1739 01/06/17 1746  NA 138 140 140 136 139  K 2.9* 2.8* 3.4* 3.4* 3.5  CL  --   --   --  108 105  CO2 25 26 20* 22  --   GLUCOSE  135 82 94 60* 58*  BUN 25.0 29.4* 28.0* 19 16  CREATININE 1.0 1.1 0.9 0.71 0.70  CALCIUM 9.2 9.2 8.9 8.2*  --   MG  --   --  2.1 1.8  --    GFR: Estimated Creatinine Clearance: 70.1 mL/min (by C-G formula based on SCr of 0.7 mg/dL). Liver Function Tests: Recent Labs  Lab 01/03/17 1037 01/04/17 1157 01/05/17 1016 01/06/17 1739  AST 15 21 54* 70*  ALT 14 15 28  43  ALKPHOS 73 74 86 67  BILITOT 1.52* 1.15 0.74 0.8  PROT 6.0* 6.0* 5.7* 5.3*  ALBUMIN 2.8* 2.6* 2.4* 2.3*   Recent Labs  Lab 01/06/17 1739  LIPASE 35   No results for input(s): AMMONIA in the last 168 hours. Coagulation Profile: No results for input(s): INR, PROTIME in the last 168 hours. Cardiac Enzymes: No results for input(s): CKTOTAL, CKMB, CKMBINDEX, TROPONINI in the last 168 hours. BNP (last 3 results) No results for input(s): PROBNP in the last 8760 hours. HbA1C: No results for input(s): HGBA1C in the last 72 hours. CBG: Recent Labs  Lab 01/06/17 1722  GLUCAP 57*   Lipid Profile: No results for input(s): CHOL, HDL, LDLCALC, TRIG, CHOLHDL, LDLDIRECT in the last 72 hours. Thyroid Function Tests: No results for input(s): TSH, T4TOTAL, FREET4, T3FREE, THYROIDAB in the last 72 hours. Anemia Panel: No results for input(s): VITAMINB12, FOLATE, FERRITIN, TIBC, IRON, RETICCTPCT  in the last 72 hours. Urine analysis:    Component Value Date/Time   LABSPEC 1.020 01/03/2017 1427   PHURINE 6.0 01/03/2017 1427   GLUCOSEU Negative 01/03/2017 1427   HGBUR Negative 01/03/2017 1427   BILIRUBINUR Negative 01/03/2017 1427   KETONESUR 5 01/03/2017 1427   PROTEINUR < 30 01/03/2017 1427   UROBILINOGEN 0.2 01/03/2017 1427   NITRITE Negative 01/03/2017 1427   LEUKOCYTESUR Negative 01/03/2017 1427   Sepsis Labs: !!!!!!!!!!!!!!!!!!!!!!!!!!!!!!!!!!!!!!!!!!!! @LABRCNTIP (procalcitonin:4,lacticidven:4) ) Recent Results (from the past 240 hour(s))  Culture, Blood     Status: None (Preliminary result)   Collection Time: 01/04/17 11:57 AM  Result Value Ref Range Status   BLOOD CULTURE, ROUTINE Preliminary report  Preliminary   Organism ID, Bacteria Comment  Preliminary    Comment: No growth in 36 - 48 hours.  Culture, Blood     Status: None (Preliminary result)   Collection Time: 01/04/17 11:57 AM  Result Value Ref Range Status   BLOOD CULTURE, ROUTINE Preliminary report  Preliminary   Organism ID, Bacteria Comment  Preliminary    Comment: No growth in 36 - 48 hours.  TECHNOLOGIST REVIEW     Status: None   Collection Time: 01/05/17 10:16 AM  Result Value Ref Range Status   Technologist Review Rare meta, few ovalos  Final     Radiological Exams on Admission: Ct Abdomen Pelvis W Contrast  Result Date: 01/05/2017 CLINICAL DATA:  83 year old male with history of pancreatic carcinoma, with diagnosis [place]. Given history of oncology with Dr. Truitt Merle, and CT from outside institutions. EXAM: CT ABDOMEN AND PELVIS WITH CONTRAST TECHNIQUE: Multidetector CT imaging of the abdomen and pelvis was performed using the standard protocol following bolus administration of intravenous contrast. CONTRAST:  183mL ISOVUE-300 IOPAMIDOL (ISOVUE-300) INJECTION 61% COMPARISON:  No comparison available FINDINGS: Lower chest: No acute abnormality. Hepatobiliary: Relatively uniform attenuation of  the liver parenchyma. There is a small hypodense focus adjacent to the falciform ligament which measures 12 mm on axial CT images, best seen on the delayed images. Unremarkable appearance of gallbladder. Mild dilation of the  proximal intrahepatic biliary ducts and the common bile duct. The common bile duct abruptly terminates within the head of the pancreas, near the cut off sign of the pancreatic duct at the pancreatic head. Pancreas: Hypoenhancing mass involving the uncinate process and pancreatic head. The axial measurements estimate 5.1 cm x 3.6 cm, just anterior to the transverse duodenum. The mass is inseparable from the proximal superior mesenteric artery, with obliteration of the fat planes. The inferior/ posterior pancreatic mass inseparable from the duodenum. The right aspects of the mass extend towards the pancreatic head where there is a cut off of the pancreatic duct and the common bile duct. Distal pancreatic duct is dilated, measuring as low grade as 4 mm in diameter. No comparison CT imaging available. Small lymph nodes in the gastro pathic ligament. Borderline enlarged portacaval node measuring 8 mm. Rounded soft tissue nodule measures 11 mm just inferior to the pancreatic neck. Portal vein, splenic vein, superior mesenteric vein, and inferior mesenteric vein patent on the study. Spleen: Unremarkable spleen Adrenals/Urinary Tract: Unremarkable adrenal glands. Bilateral kidneys unremarkable with hydronephrosis. Small nonobstructive stone at the superior collecting system of the right kidney. Unremarkable course the visualized bilateral ureters. Urinary bladder with demonstrate circumferential thickening. Stomach/Bowel: Unremarkable stomach. Duodenum is attenuated at the transverse portion, which is inseparable from pancreatic mass. Proximal small bowel unremarkable with no distention. Enteric contrast reaches distal small bowel. Normal appendix. Colonic diverticula. No evidence of associated  inflammatory changes. Vascular/Lymphatic: Vascular calcifications of the aorta extending into the bilateral iliac system. Bilateral iliac and proximal femoral vasculature patent. Calcifications of the proximal bilateral renal arteries, with likely 50% stenosis bilaterally. Vascular calcifications at the origin of the celiac artery, superior mesenteric artery, and inferior mesenteric artery. Reproductive: Transverse diameter of the prostate estimated at 6.2 cm. Other: Fat containing umbilical hernia Musculoskeletal: No acute displaced fracture. Degenerative changes of the lumbar spine. IMPRESSION: Hypoenhancing mass of the uncinate process, compatible with adenocarcinoma. CT is positive for vascular involvement of the proximal superior mesenteric artery at the posterior margin. Comparison with outside CT imaging may be useful. Pancreatic mass is inseparable from the transverse duodenum, with narrowing of the lumen of the transverse duodenum. At least stenosis if not occlusion of the pancreatic duct, with questionable obstruction of the common bile duct. There is associated mild intrahepatic biliary ductal dilatation. Again, comparison with outside CT imaging may be useful to evaluate for progression. Question of small metastatic implant of the left liver adjacent to falciform ligament. Alternatively this could represent focal fat. MRI may be considered, if warranted. Aortic atherosclerosis, mesenteric atherosclerosis, and bilateral renal artery atherosclerosis. Aortic Atherosclerosis (ICD10-I70.0). Electronically Signed   By: Corrie Mckusick D.O.   On: 01/05/2017 16:10    EKG: Independently reviewed.  Assessment/Plan Active Problems:   Dehydration   Presyncope/dizziness Mild to moderate dehydration secondary to poor oral intake - Admitted to the hospital for IV fluids until further plans are made for his pancreatic adenocarcinoma compressing transverse duodenum -Diet as tolerated, antiemetics, D5  half-normal saline with potassium - Monitor urine output -Provide supportive care - Possible need for feeding tube  Adenocarcinoma of pancreatic head, stage IIb -Patient follows with Dr. Annamaria Boots outpatient, status post 1 round of chemotherapy for 3 weeks with Gemcitabine and Abraxane. - Patient would benefit from palliative care consult in the morning, Dr. Pilar Plate and also assist with determining his long-term prognosis  Diabetes type 2 -We will hold his home regimen of Lantus given poor oral intake -Continue Accu-Cheks and sliding scale  Essential hypertension - Used to be on Norvasc but no longer taking it  Hyperlipidemia -Continue statin  GERD -Continue PPI  DVT prophylaxis: Lovenox Code Status: DO NOT RESUSCITATE  family Communication: Daughter and wife at bedside Disposition Plan: TBD Consults called: Consult for Dr Burr Medico placed Admission status: Inpatient  Azlyn Wingler Arsenio Loader MD [facility]   If 7PM-7AM, please contact night-coverage [email or web address] Password [email or web address]  01/06/2017, 7:12 PM

## 2017-01-06 NOTE — Telephone Encounter (Signed)
Received call from Cody Mckay, son in-law requesting a call back from nurse.  Spoke with Cody Mckay, and was informed that pt has heart rate of 46 - 48,  BP  130/62.  Per Cody Mckay, pt is lethargic but able to recognize family.  Pt was to be on liquid only; however, pt had not been taking much fluids for fear of vomiting.   Pt is weak.  Temp 99 early am, now is 99.2 .   Dr. Burr Medico notified.  Spoke with Cody Mckay again, and instructed him to take pt to ER now for further evaluation as per Dr. Burr Medico. Cody Mckay voiced understanding. Cody Mckay's    Phone      [phone].

## 2017-01-06 NOTE — ED Notes (Signed)
ED TO INPATIENT HANDOFF REPORT  Name/Age/Gender Cody Mckay 83 y.o. male  Code Status Code Status History    This patient does not have a recorded code status. Please follow your organizational policy for patients in this situation.    Advance Directive Documentation     Most Recent Value  Type of Advance Directive  Healthcare Power of Attorney, Living will  Pre-existing out of facility DNR order (yellow form or pink MOST form)  No data  "MOST" Form in Place?  No data      Home/SNF/Other Home  Chief Complaint low heart rate - cancer pt   Level of Care/Admitting Diagnosis ED Disposition    ED Disposition Condition Comment   Admit  Hospital Area: [facility] [[id number]]  Level of Care: Med-Surg [16]  Diagnosis: Dehydration [276.51.ICD-9-CM]  Admitting Physician: Gerlean Ren [facility] [[id number]]  Attending Physician: Gerlean Ren [facility] [[id number]]  Estimated length of stay: past midnight tomorrow  Certification:: I certify this patient will need inpatient services for at least 2 midnights  PT Class (Do Not Modify): Inpatient [101]  PT Acc Code (Do Not Modify): Private [1]       Medical History Past Medical History:  Diagnosis Date  . Chicken pox   . Diabetes mellitus without complication ([place])   . Diverticulitis   . Gastric ulcer   . Hyperlipidemia   . Hypertension     Allergies Allergies  Allergen Reactions  . Codeine Other (See Comments)    Schizophrenic reactions.    IV Location/Drains/Wounds Patient Lines/Drains/Airways Status   Active Line/Drains/Airways    Name:   Placement date:   Placement time:   Site:   Days:   Implanted Port Right Chest   -    -    Chest             Labs/Imaging Results for orders placed or performed during the hospital encounter of 01/06/17 (from the past 48 hour(s))  POC CBG, ED     Status: Abnormal   Collection Time: 01/06/17  5:22 PM  Result Value Ref Range   Glucose-Capillary 57 (L) 65 - 99 mg/dL   CBC with Differential     Status: Abnormal   Collection Time: 01/06/17  5:39 PM  Result Value Ref Range   WBC 14.9 (H) 4.0 - 10.5 K/uL   RBC 3.24 (L) 4.22 - 5.81 MIL/uL   Hemoglobin 9.8 (L) 13.0 - 17.0 g/dL   HCT 29.0 (L) 39.0 - 52.0 %   MCV 89.5 78.0 - 100.0 fL   MCH 30.2 26.0 - 34.0 pg   MCHC 33.8 30.0 - 36.0 g/dL   RDW 15.3 11.5 - 15.5 %   Platelets 274 150 - 400 K/uL   Neutrophils Relative % 76 %   Neutro Abs 11.3 (H) 1.7 - 7.7 K/uL   Lymphocytes Relative 9 %   Lymphs Abs 1.4 0.7 - 4.0 K/uL   Monocytes Relative 15 %   Monocytes Absolute 2.2 (H) 0.1 - 1.0 K/uL   Eosinophils Relative 0 %   Eosinophils Absolute 0.1 0.0 - 0.7 K/uL   Basophils Relative 0 %   Basophils Absolute 0.0 0.0 - 0.1 K/uL  Comprehensive metabolic panel     Status: Abnormal   Collection Time: 01/06/17  5:39 PM  Result Value Ref Range   Sodium 136 135 - 145 mmol/L   Potassium 3.4 (L) 3.5 - 5.1 mmol/L   Chloride 108 101 - 111 mmol/L   CO2 22 22 -  32 mmol/L   Glucose, Bld 60 (L) 65 - 99 mg/dL   BUN 19 6 - 20 mg/dL   Creatinine, Ser 0.71 0.61 - 1.24 mg/dL   Calcium 8.2 (L) 8.9 - 10.3 mg/dL   Total Protein 5.3 (L) 6.5 - 8.1 g/dL   Albumin 2.3 (L) 3.5 - 5.0 g/dL   AST 70 (H) 15 - 41 U/L   ALT 43 17 - 63 U/L   Alkaline Phosphatase 67 38 - 126 U/L   Total Bilirubin 0.8 0.3 - 1.2 mg/dL   GFR calc non Af Amer >60 >60 mL/min   GFR calc Af Amer >60 >60 mL/min    Comment: (NOTE) The eGFR has been calculated using the CKD EPI equation. This calculation has not been validated in all clinical situations. eGFR's persistently <60 mL/min signify possible Chronic Kidney Disease.    Anion gap 6 5 - 15  Magnesium     Status: None   Collection Time: 01/06/17  5:39 PM  Result Value Ref Range   Magnesium 1.8 1.7 - 2.4 mg/dL  Lipase, blood     Status: None   Collection Time: 01/06/17  5:39 PM  Result Value Ref Range   Lipase 35 11 - 51 U/L  I-Stat Troponin, ED (not at [facility])     Status: None   Collection Time:  01/06/17  5:44 PM  Result Value Ref Range   Troponin i, poc 0.07 0.00 - 0.08 ng/mL   Comment 3            Comment: Due to the release kinetics of cTnI, a negative result within the first hours of the onset of symptoms does not rule out myocardial infarction with certainty. If myocardial infarction is still suspected, repeat the test at appropriate intervals.   I-Stat Chem 8, ED     Status: Abnormal   Collection Time: 01/06/17  5:46 PM  Result Value Ref Range   Sodium 139 135 - 145 mmol/L   Potassium 3.5 3.5 - 5.1 mmol/L   Chloride 105 101 - 111 mmol/L   BUN 16 6 - 20 mg/dL   Creatinine, Ser 0.70 0.61 - 1.24 mg/dL   Glucose, Bld 58 (L) 65 - 99 mg/dL   Calcium, Ion 1.16 1.15 - 1.40 mmol/L   TCO2 22 22 - 32 mmol/L   Hemoglobin 9.2 (L) 13.0 - 17.0 g/dL   HCT 27.0 (L) 39.0 - 52.0 %  Urinalysis, Routine w reflex microscopic     Status: Abnormal   Collection Time: 01/06/17  7:12 PM  Result Value Ref Range   Color, Urine YELLOW YELLOW   APPearance HAZY (A) CLEAR   Specific Gravity, Urine 1.027 1.005 - 1.030   pH 5.0 5.0 - 8.0   Glucose, UA NEGATIVE NEGATIVE mg/dL   Hgb urine dipstick NEGATIVE NEGATIVE   Bilirubin Urine NEGATIVE NEGATIVE   Ketones, ur NEGATIVE NEGATIVE mg/dL   Protein, ur NEGATIVE NEGATIVE mg/dL   Nitrite NEGATIVE NEGATIVE   Leukocytes, UA NEGATIVE NEGATIVE   Ct Abdomen Pelvis W Contrast  Result Date: 01/05/2017 CLINICAL DATA:  83 year old male with history of pancreatic carcinoma, with diagnosis [place]. Given history of oncology with Dr. Truitt Merle, and CT from outside institutions. EXAM: CT ABDOMEN AND PELVIS WITH CONTRAST TECHNIQUE: Multidetector CT imaging of the abdomen and pelvis was performed using the standard protocol following bolus administration of intravenous contrast. CONTRAST:  150m ISOVUE-300 IOPAMIDOL (ISOVUE-300) INJECTION 61% COMPARISON:  No comparison available FINDINGS: Lower chest: No acute abnormality. Hepatobiliary: Relatively  uniform  attenuation of the liver parenchyma. There is a small hypodense focus adjacent to the falciform ligament which measures 12 mm on axial CT images, best seen on the delayed images. Unremarkable appearance of gallbladder. Mild dilation of the proximal intrahepatic biliary ducts and the common bile duct. The common bile duct abruptly terminates within the head of the pancreas, near the cut off sign of the pancreatic duct at the pancreatic head. Pancreas: Hypoenhancing mass involving the uncinate process and pancreatic head. The axial measurements estimate 5.1 cm x 3.6 cm, just anterior to the transverse duodenum. The mass is inseparable from the proximal superior mesenteric artery, with obliteration of the fat planes. The inferior/ posterior pancreatic mass inseparable from the duodenum. The right aspects of the mass extend towards the pancreatic head where there is a cut off of the pancreatic duct and the common bile duct. Distal pancreatic duct is dilated, measuring as low grade as 4 mm in diameter. No comparison CT imaging available. Small lymph nodes in the gastro pathic ligament. Borderline enlarged portacaval node measuring 8 mm. Rounded soft tissue nodule measures 11 mm just inferior to the pancreatic neck. Portal vein, splenic vein, superior mesenteric vein, and inferior mesenteric vein patent on the study. Spleen: Unremarkable spleen Adrenals/Urinary Tract: Unremarkable adrenal glands. Bilateral kidneys unremarkable with hydronephrosis. Small nonobstructive stone at the superior collecting system of the right kidney. Unremarkable course the visualized bilateral ureters. Urinary bladder with demonstrate circumferential thickening. Stomach/Bowel: Unremarkable stomach. Duodenum is attenuated at the transverse portion, which is inseparable from pancreatic mass. Proximal small bowel unremarkable with no distention. Enteric contrast reaches distal small bowel. Normal appendix. Colonic diverticula. No evidence of  associated inflammatory changes. Vascular/Lymphatic: Vascular calcifications of the aorta extending into the bilateral iliac system. Bilateral iliac and proximal femoral vasculature patent. Calcifications of the proximal bilateral renal arteries, with likely 50% stenosis bilaterally. Vascular calcifications at the origin of the celiac artery, superior mesenteric artery, and inferior mesenteric artery. Reproductive: Transverse diameter of the prostate estimated at 6.2 cm. Other: Fat containing umbilical hernia Musculoskeletal: No acute displaced fracture. Degenerative changes of the lumbar spine. IMPRESSION: Hypoenhancing mass of the uncinate process, compatible with adenocarcinoma. CT is positive for vascular involvement of the proximal superior mesenteric artery at the posterior margin. Comparison with outside CT imaging may be useful. Pancreatic mass is inseparable from the transverse duodenum, with narrowing of the lumen of the transverse duodenum. At least stenosis if not occlusion of the pancreatic duct, with questionable obstruction of the common bile duct. There is associated mild intrahepatic biliary ductal dilatation. Again, comparison with outside CT imaging may be useful to evaluate for progression. Question of small metastatic implant of the left liver adjacent to falciform ligament. Alternatively this could represent focal fat. MRI may be considered, if warranted. Aortic atherosclerosis, mesenteric atherosclerosis, and bilateral renal artery atherosclerosis. Aortic Atherosclerosis (ICD10-I70.0). Electronically Signed   By: Corrie Mckusick D.O.   On: 01/05/2017 16:10    Pending Labs [employer] (From admission, onward)   Start     Ordered   Signed and Held  CBC  (enoxaparin (LOVENOX)    CrCl >/= 30 ml/min)  Once,   R    Comments:  Baseline for enoxaparin therapy IF NOT ALREADY DRAWN.  Notify MD if PLT < 100 K.    Signed and Held   Signed and Held  Creatinine, serum  (enoxaparin (LOVENOX)     CrCl >/= 30 ml/min)  Once,   R    Comments:  Baseline  for enoxaparin therapy IF NOT ALREADY DRAWN.    Signed and Held   Signed and Held  Creatinine, serum  (enoxaparin (LOVENOX)    CrCl >/= 30 ml/min)  Weekly,   R    Comments:  while on enoxaparin therapy    Signed and Held   Signed and Held  Comprehensive metabolic panel  Tomorrow morning,   R     Signed and Held   Signed and Held  CBC  Tomorrow morning,   R     Signed and Held   Signed and Held  Protime-INR  Tomorrow morning,   R     Signed and Held   Signed and Held  APTT  Tomorrow morning,   R     Signed and Held      Vitals/Pain Today's Vitals   01/06/17 1623 01/06/17 1730 01/06/17 1800 01/06/17 1830  BP:  (!) 150/62 138/63 136/78  Pulse:  (!) 47 (!) 42 (!) 53  Resp:  17 19 (!) 22  Temp:      TempSrc:      SpO2:  97% 95% 97%  PainSc: 4        Isolation Precautions No active isolations  Medications Medications  sodium chloride 0.9 % bolus 1,000 mL (0 mLs Intravenous Stopped 01/06/17 1855)  0.9 %  sodium chloride infusion ( Intravenous New Bag/Given 01/06/17 1919)    Mobility walks with person assist

## 2017-01-06 NOTE — ED Triage Notes (Signed)
Patient here from home with complaints of weakness related to hx of pancreatic cancer. Next chemo treatment Friday. Reports "I just need fluids and some potassium". States that he was seen in the office on Monday and got fluids and 40 of potassium, felt better. Nausea, no vomiting today.

## 2017-01-07 ENCOUNTER — Ambulatory Visit: Payer: Medicare Other | Admitting: Hematology

## 2017-01-07 ENCOUNTER — Other Ambulatory Visit: Payer: Self-pay

## 2017-01-07 ENCOUNTER — Other Ambulatory Visit: Payer: Medicare Other

## 2017-01-07 ENCOUNTER — Ambulatory Visit: Payer: Medicare Other

## 2017-01-07 DIAGNOSIS — R531 Weakness: Secondary | ICD-10-CM

## 2017-01-07 DIAGNOSIS — E119 Type 2 diabetes mellitus without complications: Secondary | ICD-10-CM

## 2017-01-07 DIAGNOSIS — Z66 Do not resuscitate: Secondary | ICD-10-CM

## 2017-01-07 DIAGNOSIS — K315 Obstruction of duodenum: Principal | ICD-10-CM

## 2017-01-07 DIAGNOSIS — E43 Unspecified severe protein-calorie malnutrition: Secondary | ICD-10-CM

## 2017-01-07 DIAGNOSIS — C250 Malignant neoplasm of head of pancreas: Secondary | ICD-10-CM

## 2017-01-07 DIAGNOSIS — K219 Gastro-esophageal reflux disease without esophagitis: Secondary | ICD-10-CM

## 2017-01-07 DIAGNOSIS — I10 Essential (primary) hypertension: Secondary | ICD-10-CM

## 2017-01-07 LAB — GLUCOSE, CAPILLARY
GLUCOSE-CAPILLARY: 151 mg/dL — AB (ref 65–99)
Glucose-Capillary: 183 mg/dL — ABNORMAL HIGH (ref 65–99)
Glucose-Capillary: 88 mg/dL (ref 65–99)
Glucose-Capillary: 163 mg/dL — ABNORMAL HIGH (ref 65–99)

## 2017-01-07 LAB — COMPREHENSIVE METABOLIC PANEL
ALK PHOS: 73 U/L (ref 38–126)
ALT: 40 U/L (ref 17–63)
ANION GAP: 7 (ref 5–15)
AST: 52 U/L — ABNORMAL HIGH (ref 15–41)
Albumin: 2.3 g/dL — ABNORMAL LOW (ref 3.5–5.0)
BILIRUBIN TOTAL: 0.6 mg/dL (ref 0.3–1.2)
BUN: 13 mg/dL (ref 6–20)
CALCIUM: 8.2 mg/dL — AB (ref 8.9–10.3)
CO2: 22 mmol/L (ref 22–32)
Chloride: 110 mmol/L (ref 101–111)
Creatinine, Ser: 0.68 mg/dL (ref 0.61–1.24)
GLUCOSE: 85 mg/dL (ref 65–99)
POTASSIUM: 3.4 mmol/L — AB (ref 3.5–5.1)
Sodium: 139 mmol/L (ref 135–145)
TOTAL PROTEIN: 5.3 g/dL — AB (ref 6.5–8.1)

## 2017-01-07 LAB — CBC
HCT: 28.8 % — ABNORMAL LOW (ref 39.0–52.0)
HEMOGLOBIN: 9.4 g/dL — AB (ref 13.0–17.0)
MCH: 29.4 pg (ref 26.0–34.0)
MCHC: 32.6 g/dL (ref 30.0–36.0)
MCV: 90 fL (ref 78.0–100.0)
Platelets: 304 10*3/uL (ref 150–400)
RBC: 3.2 MIL/uL — ABNORMAL LOW (ref 4.22–5.81)
RDW: 15 % (ref 11.5–15.5)
WBC: 14.4 10*3/uL — AB (ref 4.0–10.5)

## 2017-01-07 LAB — PROTIME-INR
INR: 1.41
PROTHROMBIN TIME: 17.2 s — AB (ref 11.4–15.2)

## 2017-01-07 LAB — APTT: APTT: 34 s (ref 24–36)

## 2017-01-07 MED ORDER — ONDANSETRON HCL 4 MG/2ML IJ SOLN
4.0000 mg | Freq: Four times a day (QID) | INTRAMUSCULAR | Status: DC | PRN
  Administered 2017-01-07 – 2017-01-09 (×3): 4 mg via INTRAVENOUS
  Filled 2017-01-07 (×3): qty 2

## 2017-01-07 MED ORDER — GLUCERNA SHAKE PO LIQD
237.0000 mL | Freq: Three times a day (TID) | ORAL | Status: DC
  Administered 2017-01-07 – 2017-01-09 (×6): 237 mL via ORAL
  Filled 2017-01-07 (×14): qty 237

## 2017-01-07 NOTE — Progress Notes (Signed)
Pt is active with [facility] for HHRN/PT. Per MD notes, plan is for new J-tube. Will need resumption orders for HHPT/RN and orders for tube feedings and pump (if continuous). [facility] rep alerted of new plan for J-tube and will follow along. Marney Doctor RN,BSN,NCM [phone]

## 2017-01-07 NOTE — Progress Notes (Signed)
Initial Nutrition Assessment  DOCUMENTATION CODES:   Severe malnutrition in context of chronic illness  INTERVENTION:   Glucerna Shake po TID, each supplement provides 220 kcal and 10 grams of protein   Once PEJ placed: Osmolite 1.5 @ 20 ml/hr increasing by 10 ml Q6 hours to goal rate of 60 ml/hr 30 ml Prostat BID  NUTRITION DIAGNOSIS:   Severe Malnutrition related to cancer and cancer related treatments(pancreatic) as evidenced by severe fat depletion, severe muscle depletion, moderate muscle depletion, moderate fat depletion.  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 Pt presents this admission with poor appetite and fluid intake. Over the last three days pt was seen at Dr. Ernestina Penna office for IV fluids and potassium repletion. Pt interested in PEJ tube placement. [facility] notes placement likely scheduled for 12/10.    Spoke with pt at bedside. Pt upset that he ate jello this morning and cannot have PEJ tube placed today. Pt asking if his chemotherapy can be rescheduled for today since his PEJ tube has been postponed. Relayed this to nurse. Reports having loss in appetite for the last two months after finding out he had cancer. Typically pt consumes 1 meal per day with 3 Glucerna's. Will continue with Glucerna. Denies taste changes with chemotherapy. RD provided recommendations for tube feeding s/p PEJ tube. See above.  Pt reports a UBW of 216 lb, unsure of the last time he was at that weight. Records are limited in weight history. Unable to determine wt loss at  this time, though suspect he has lost a significant amount.  Nutrition-Focused physical exam completed. Pt shows to be severely malnourished.   Medications reviewed and include: calcium, vit D, SSI, niacin, NaCl with D5 and KCl @ 75 ml/hr Labs reviewed: K 3.4 (L)   NUTRITION - FOCUSED PHYSICAL EXAM:    Most Recent Value  Orbital Region  Moderate depletion  Upper Arm Region  Severe depletion  Thoracic and Lumbar Region  Unable to assess  Buccal Region  Severe depletion  Temple Region  Severe depletion  Clavicle Bone Region  Severe depletion  Clavicle and Acromion Bone Region  Severe depletion  Scapular Bone Region  Unable to assess  Dorsal Hand  Severe depletion  Patellar Region  Moderate depletion  Anterior Thigh Region  Moderate depletion  Posterior Calf Region  Moderate depletion  Edema (RD Assessment)  None  Hair  Reviewed  Eyes  Reviewed  Mouth  Reviewed  Skin  Reviewed  Nails  Reviewed       Diet Order:  Diet regular Room service appropriate? Yes; Fluid consistency: Thin  EDUCATION NEEDS:   Not appropriate for education at this time  Skin:  Skin Assessment: Reviewed RN Assessment  Last BM:  01/04/17  Height:   Ht Readings from Last 1 Encounters:  01/06/17 5\' 10"  (1.778 m)    Weight:   Wt Readings from Last 1 Encounters:  01/06/17 158 lb 11.7 oz (72 kg)    Ideal Body Weight:  75.5 kg  BMI:  Body mass index is 22.78 kg/m.  Estimated Nutritional Needs:   Kcal:  4270-6237 kcal/day  Protein:  115-125 g/day  Fluid:  >2.1 L/day    Mariana Single RD, LDN Clinical Nutrition Pager # - [phone]

## 2017-01-07 NOTE — Consult Note (Signed)
[facility] Surgery Consult/Admission Note  Cody Mckay [birth year]  [id number].    Requesting MD: Dr. Burr Medico Chief Complaint/Reason for Consult: J tube placement  HPI:   Pt is a 83 y.o.malewith medical history significant ofadenocarcinoma of the pancreas on chemotherapy, diabetes type 2, hypertension, hyperlipidemia, GERD who presented to the ED with complaints of worsening nausea, vomiting, poor PO intake and dehydration. Dr. Burr Medico has been following the pt for his cancer. Pt has a partial duodenal obstruction 2/2 his pancreatic cancer. Pt is not currently in any pain or has any complaints. He had jello this AM. He would like to proceed with J tube placement. No family at bedside. He has not had any previous abdominal surgeries. Pt had a normal BM today.  ROS:  Review of Systems  Constitutional: Negative for chills and fever.  Respiratory: Negative for shortness of breath.   Cardiovascular: Negative for chest pain.  Gastrointestinal: Positive for nausea and vomiting.  Neurological: Negative for sensory change, focal weakness and loss of consciousness.  All other systems reviewed and are negative.    Family History  Problem Relation Age of Onset  . Cancer Father        lung cancer  . Cancer Son        testicular cancer     Past Medical History:  Diagnosis Date  . Chicken pox   . Diabetes mellitus without complication ([place])   . Diverticulitis   . Gastric ulcer   . Hyperlipidemia   . Hypertension     Past Surgical History:  Procedure Laterality Date  . REPAIR OF PERFORATED ULCER    . TONSILLECTOMY     pt was 47 at time of removal    Social History:  reports that  has never smoked. he has never used smokeless tobacco. He reports that he does not drink alcohol or use drugs.  Allergies:  Allergies  Allergen Reactions  . Codeine Other (See Comments)    Schizophrenic reactions.    Medications Prior to Admission  Medication Sig Dispense Refill  .  acetaminophen (TYLENOL) 500 MG tablet Take 500 mg by mouth every 6 (six) hours as needed.    Marland Kitchen alfuzosin (UROXATRAL) 10 MG 24 hr tablet     . atorvastatin (LIPITOR) 10 MG tablet Take 5 mg by mouth daily at 6 PM.     . Calcium-Magnesium-Vitamin D (CALCIUM 500 PO) Take 1 tablet by mouth daily.    . diphenoxylate-atropine (LOMOTIL) 2.5-0.025 MG tablet     . esomeprazole (NEXIUM) 40 MG capsule Take 2 capsules by mouth daily.    . insulin aspart (NOVOLOG) 100 UNIT/ML injection Inject 5 Units into the skin 3 (three) times daily before meals.    Marland Kitchen LANTUS SOLOSTAR 100 UNIT/ML Solostar Pen Inject 15 Units into the skin daily.    Marland Kitchen NIASPAN 750 MG CR tablet Take 750 mg by mouth 2 (two) times daily.     . ondansetron (ZOFRAN) 8 MG tablet     . potassium chloride SA (K-DUR,KLOR-CON) 20 MEQ tablet Take 60 mEq by mouth daily.     . prochlorperazine (COMPAZINE) 10 MG tablet as needed.    . pyridOXINE (VITAMIN B-6) 100 MG tablet Take 100 mg by mouth daily.    Marland Kitchen triamcinolone ointment (KENALOG) 0.1 % as needed.    Marland Kitchen amLODipine (NORVASC) 5 MG tablet     . glucose blood (ONETOUCH VERIO) test strip Use as instructed 100 each 12  . Insulin Syringe-Needle U-100 (INSULIN SYRINGE 1CC/30GX5/16") 30G  X 5/16" 1 ML MISC . 100 each 12  . Lancets (ONETOUCH ULTRASOFT) lancets Use as instructed 100 each 12  . lidocaine-prilocaine (EMLA) cream Apply 1 application topically as needed. 30 g 2    Blood pressure 130/76, pulse 85, temperature 98.3 F (36.8 C), temperature source Oral, resp. rate 18, height 5' 10"  (1.778 m), weight 158 lb 11.7 oz (72 kg), SpO2 98 %.  Physical Exam  Constitutional: No distress.  Thin, elderly, pale, pleasant white male  HENT:  Head: Normocephalic and atraumatic.  Nose: Nose normal.  Mouth/Throat: Oropharynx is clear and moist. No oropharyngeal exudate.  Eyes: Conjunctivae are normal. Pupils are equal, round, and reactive to light. Right eye exhibits no discharge. Left eye exhibits no  discharge. No scleral icterus.  Neck: Normal range of motion. Neck supple. No thyromegaly present.  Cardiovascular: Normal rate, regular rhythm and normal heart sounds. Exam reveals no gallop and no friction rub.  No murmur heard. Pulses:      Radial pulses are 2+ on the right side, and 2+ on the left side.  Pulmonary/Chest: Effort normal. No respiratory distress. He has no wheezes. He has no rhonchi. He has no rales.  Abdominal: Soft. Normal appearance and bowel sounds are normal. He exhibits no distension. There is no hepatosplenomegaly. There is no tenderness.  Musculoskeletal: He exhibits no edema or tenderness.  Lymphadenopathy:    He has no cervical adenopathy.  Neurological: He is alert. GCS score is 15.  Skin: Skin is warm and dry. No rash noted. He is not diaphoretic. There is pallor.  Psychiatric: Mood and affect normal.  Nursing note and vitals reviewed.   Results for orders placed or performed during the hospital encounter of 01/06/17 (from the past 48 hour(s))  POC CBG, ED     Status: Abnormal   Collection Time: 01/06/17  5:22 PM  Result Value Ref Range   Glucose-Capillary 57 (L) 65 - 99 mg/dL  CBC with Differential     Status: Abnormal   Collection Time: 01/06/17  5:39 PM  Result Value Ref Range   WBC 14.9 (H) 4.0 - 10.5 K/uL   RBC 3.24 (L) 4.22 - 5.81 MIL/uL   Hemoglobin 9.8 (L) 13.0 - 17.0 g/dL   HCT 29.0 (L) 39.0 - 52.0 %   MCV 89.5 78.0 - 100.0 fL   MCH 30.2 26.0 - 34.0 pg   MCHC 33.8 30.0 - 36.0 g/dL   RDW 15.3 11.5 - 15.5 %   Platelets 274 150 - 400 K/uL   Neutrophils Relative % 76 %   Neutro Abs 11.3 (H) 1.7 - 7.7 K/uL   Lymphocytes Relative 9 %   Lymphs Abs 1.4 0.7 - 4.0 K/uL   Monocytes Relative 15 %   Monocytes Absolute 2.2 (H) 0.1 - 1.0 K/uL   Eosinophils Relative 0 %   Eosinophils Absolute 0.1 0.0 - 0.7 K/uL   Basophils Relative 0 %   Basophils Absolute 0.0 0.0 - 0.1 K/uL  Comprehensive metabolic panel     Status: Abnormal   Collection Time:  01/06/17  5:39 PM  Result Value Ref Range   Sodium 136 135 - 145 mmol/L   Potassium 3.4 (L) 3.5 - 5.1 mmol/L   Chloride 108 101 - 111 mmol/L   CO2 22 22 - 32 mmol/L   Glucose, Bld 60 (L) 65 - 99 mg/dL   BUN 19 6 - 20 mg/dL   Creatinine, Ser 0.71 0.61 - 1.24 mg/dL   Calcium 8.2 (L) 8.9 -  10.3 mg/dL   Total Protein 5.3 (L) 6.5 - 8.1 g/dL   Albumin 2.3 (L) 3.5 - 5.0 g/dL   AST 70 (H) 15 - 41 U/L   ALT 43 17 - 63 U/L   Alkaline Phosphatase 67 38 - 126 U/L   Total Bilirubin 0.8 0.3 - 1.2 mg/dL   GFR calc non Af Amer >60 >60 mL/min   GFR calc Af Amer >60 >60 mL/min    Comment: (NOTE) The eGFR has been calculated using the CKD EPI equation. This calculation has not been validated in all clinical situations. eGFR's persistently <60 mL/min signify possible Chronic Kidney Disease.    Anion gap 6 5 - 15  Magnesium     Status: None   Collection Time: 01/06/17  5:39 PM  Result Value Ref Range   Magnesium 1.8 1.7 - 2.4 mg/dL  Lipase, blood     Status: None   Collection Time: 01/06/17  5:39 PM  Result Value Ref Range   Lipase 35 11 - 51 U/L  I-Stat Troponin, ED (not at [facility])     Status: None   Collection Time: 01/06/17  5:44 PM  Result Value Ref Range   Troponin i, poc 0.07 0.00 - 0.08 ng/mL   Comment 3            Comment: Due to the release kinetics of cTnI, a negative result within the first hours of the onset of symptoms does not rule out myocardial infarction with certainty. If myocardial infarction is still suspected, repeat the test at appropriate intervals.   I-Stat Chem 8, ED     Status: Abnormal   Collection Time: 01/06/17  5:46 PM  Result Value Ref Range   Sodium 139 135 - 145 mmol/L   Potassium 3.5 3.5 - 5.1 mmol/L   Chloride 105 101 - 111 mmol/L   BUN 16 6 - 20 mg/dL   Creatinine, Ser 0.70 0.61 - 1.24 mg/dL   Glucose, Bld 58 (L) 65 - 99 mg/dL   Calcium, Ion 1.16 1.15 - 1.40 mmol/L   TCO2 22 22 - 32 mmol/L   Hemoglobin 9.2 (L) 13.0 - 17.0 g/dL   HCT 27.0 (L) 39.0 -  52.0 %  Urinalysis, Routine w reflex microscopic     Status: Abnormal   Collection Time: 01/06/17  7:12 PM  Result Value Ref Range   Color, Urine YELLOW YELLOW   APPearance HAZY (A) CLEAR   Specific Gravity, Urine 1.027 1.005 - 1.030   pH 5.0 5.0 - 8.0   Glucose, UA NEGATIVE NEGATIVE mg/dL   Hgb urine dipstick NEGATIVE NEGATIVE   Bilirubin Urine NEGATIVE NEGATIVE   Ketones, ur NEGATIVE NEGATIVE mg/dL   Protein, ur NEGATIVE NEGATIVE mg/dL   Nitrite NEGATIVE NEGATIVE   Leukocytes, UA NEGATIVE NEGATIVE  Glucose, capillary     Status: None   Collection Time: 01/06/17  9:52 PM  Result Value Ref Range   Glucose-Capillary 67 65 - 99 mg/dL  Glucose, capillary     Status: None   Collection Time: 01/06/17 10:53 PM  Result Value Ref Range   Glucose-Capillary 92 65 - 99 mg/dL  Glucose, capillary     Status: Abnormal   Collection Time: 01/07/17  7:49 AM  Result Value Ref Range   Glucose-Capillary 183 (H) 65 - 99 mg/dL   Comment 1 Notify RN    Comment 2 Document in Chart   Comprehensive metabolic panel     Status: Abnormal   Collection Time: 01/07/17 10:40 AM  Result Value Ref Range   Sodium 139 135 - 145 mmol/L   Potassium 3.4 (L) 3.5 - 5.1 mmol/L   Chloride 110 101 - 111 mmol/L   CO2 22 22 - 32 mmol/L   Glucose, Bld 85 65 - 99 mg/dL   BUN 13 6 - 20 mg/dL   Creatinine, Ser 0.68 0.61 - 1.24 mg/dL   Calcium 8.2 (L) 8.9 - 10.3 mg/dL   Total Protein 5.3 (L) 6.5 - 8.1 g/dL   Albumin 2.3 (L) 3.5 - 5.0 g/dL   AST 52 (H) 15 - 41 U/L   ALT 40 17 - 63 U/L   Alkaline Phosphatase 73 38 - 126 U/L   Total Bilirubin 0.6 0.3 - 1.2 mg/dL   GFR calc non Af Amer >60 >60 mL/min   GFR calc Af Amer >60 >60 mL/min    Comment: (NOTE) The eGFR has been calculated using the CKD EPI equation. This calculation has not been validated in all clinical situations. eGFR's persistently <60 mL/min signify possible Chronic Kidney Disease.    Anion gap 7 5 - 15  CBC     Status: Abnormal   Collection Time:  01/07/17 10:40 AM  Result Value Ref Range   WBC 14.4 (H) 4.0 - 10.5 K/uL   RBC 3.20 (L) 4.22 - 5.81 MIL/uL   Hemoglobin 9.4 (L) 13.0 - 17.0 g/dL   HCT 28.8 (L) 39.0 - 52.0 %   MCV 90.0 78.0 - 100.0 fL   MCH 29.4 26.0 - 34.0 pg   MCHC 32.6 30.0 - 36.0 g/dL   RDW 15.0 11.5 - 15.5 %   Platelets 304 150 - 400 K/uL  Protime-INR     Status: Abnormal   Collection Time: 01/07/17 10:40 AM  Result Value Ref Range   Prothrombin Time 17.2 (H) 11.4 - 15.2 seconds   INR 1.41   APTT     Status: None   Collection Time: 01/07/17 10:40 AM  Result Value Ref Range   aPTT 34 24 - 36 seconds  Glucose, capillary     Status: None   Collection Time: 01/07/17 12:27 PM  Result Value Ref Range   Glucose-Capillary 88 65 - 99 mg/dL   Comment 1 Notify RN    Comment 2 Document in Chart    Ct Abdomen Pelvis W Contrast  Result Date: 01/05/2017 CLINICAL DATA:  83 year old male with history of pancreatic carcinoma, with diagnosis [place]. Given history of oncology with Dr. Truitt Merle, and CT from outside institutions. EXAM: CT ABDOMEN AND PELVIS WITH CONTRAST TECHNIQUE: Multidetector CT imaging of the abdomen and pelvis was performed using the standard protocol following bolus administration of intravenous contrast. CONTRAST:  145m ISOVUE-300 IOPAMIDOL (ISOVUE-300) INJECTION 61% COMPARISON:  No comparison available FINDINGS: Lower chest: No acute abnormality. Hepatobiliary: Relatively uniform attenuation of the liver parenchyma. There is a small hypodense focus adjacent to the falciform ligament which measures 12 mm on axial CT images, best seen on the delayed images. Unremarkable appearance of gallbladder. Mild dilation of the proximal intrahepatic biliary ducts and the common bile duct. The common bile duct abruptly terminates within the head of the pancreas, near the cut off sign of the pancreatic duct at the pancreatic head. Pancreas: Hypoenhancing mass involving the uncinate process and pancreatic head. The axial  measurements estimate 5.1 cm x 3.6 cm, just anterior to the transverse duodenum. The mass is inseparable from the proximal superior mesenteric artery, with obliteration of the fat planes. The inferior/ posterior pancreatic mass inseparable from  the duodenum. The right aspects of the mass extend towards the pancreatic head where there is a cut off of the pancreatic duct and the common bile duct. Distal pancreatic duct is dilated, measuring as low grade as 4 mm in diameter. No comparison CT imaging available. Small lymph nodes in the gastro pathic ligament. Borderline enlarged portacaval node measuring 8 mm. Rounded soft tissue nodule measures 11 mm just inferior to the pancreatic neck. Portal vein, splenic vein, superior mesenteric vein, and inferior mesenteric vein patent on the study. Spleen: Unremarkable spleen Adrenals/Urinary Tract: Unremarkable adrenal glands. Bilateral kidneys unremarkable with hydronephrosis. Small nonobstructive stone at the superior collecting system of the right kidney. Unremarkable course the visualized bilateral ureters. Urinary bladder with demonstrate circumferential thickening. Stomach/Bowel: Unremarkable stomach. Duodenum is attenuated at the transverse portion, which is inseparable from pancreatic mass. Proximal small bowel unremarkable with no distention. Enteric contrast reaches distal small bowel. Normal appendix. Colonic diverticula. No evidence of associated inflammatory changes. Vascular/Lymphatic: Vascular calcifications of the aorta extending into the bilateral iliac system. Bilateral iliac and proximal femoral vasculature patent. Calcifications of the proximal bilateral renal arteries, with likely 50% stenosis bilaterally. Vascular calcifications at the origin of the celiac artery, superior mesenteric artery, and inferior mesenteric artery. Reproductive: Transverse diameter of the prostate estimated at 6.2 cm. Other: Fat containing umbilical hernia Musculoskeletal: No acute  displaced fracture. Degenerative changes of the lumbar spine. IMPRESSION: Hypoenhancing mass of the uncinate process, compatible with adenocarcinoma. CT is positive for vascular involvement of the proximal superior mesenteric artery at the posterior margin. Comparison with outside CT imaging may be useful. Pancreatic mass is inseparable from the transverse duodenum, with narrowing of the lumen of the transverse duodenum. At least stenosis if not occlusion of the pancreatic duct, with questionable obstruction of the common bile duct. There is associated mild intrahepatic biliary ductal dilatation. Again, comparison with outside CT imaging may be useful to evaluate for progression. Question of small metastatic implant of the left liver adjacent to falciform ligament. Alternatively this could represent focal fat. MRI may be considered, if warranted. Aortic atherosclerosis, mesenteric atherosclerosis, and bilateral renal artery atherosclerosis. Aortic Atherosclerosis (ICD10-I70.0). Electronically Signed   By: Corrie Mckusick D.O.   On: 01/05/2017 16:10      Assessment/Plan  Active Problems:   Pancreatic adenocarcinoma ([place])   Dehydration   Generalized weakness   Severe protein-calorie malnutrition ([place])  J tube - likely will schedule for Monday or this weekend but will discuss timing with MD  Thank you for the consult.   Kalman Drape, [facility] Surgery 01/07/2017, 1:12 PM Pager: [phone] Consults: [phone] Mon-Fri 7:00 am-4:30 pm Sat-Sun 7:00 am-11:30 am

## 2017-01-07 NOTE — Progress Notes (Signed)
PROGRESS NOTE    Cody Mckay  BJS:[id number] DOB: 1932/04/09 DOA: 01/06/2017 PCP: Patient, No Pcp Per   Brief Narrative: 83 y.o. 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  Assessment & Plan:   #Severe protein calorie malnutrition, dehydration due to poor intake in the setting of pancreatic malignancy: -Evaluated by oncologist.  As per oncology note, it was discussed with the GI as well as with surgery.  Patient will be evaluated by general surgery for possible feeding tube placement.  I have discussed with the patient and he agreed.  Continue IV fluid.  Monitor labs.  Dietary referral. -continue IVF  #Pancreatic cancer, adenocarcinoma in head of pancreas: Unresectable.  There is partial duodenal obstruction secondary due to tumor.  As per oncology, it was reviewed with GI Dr. Ardis Hughs, who does not think this is feasible for endoscopic stenting. -Continue supportive care, symptom management.  #Type 2 diabetes: Continue sliding scale.  Monitor blood sugar level.  #Hypertension: Continue to monitor blood pressure.  Blood pressure level acceptable.  #Hyperlipidemia continue current medication.  #GERD: Continue PPI  DVT prophylaxis: Lovenox subcutaneous Code Status: DNR Family Communication: No family at bedside Disposition Plan: Currently admitted    Consultants:   Oncology  General surgery  Procedures: None Antimicrobials: None  Subjective: Seen and examined at bedside.  Denies headache, dizziness, nausea or vomiting.  Reported unable to eat and feels dehydrated.  Has significant weight loss.  Objective: Vitals:   01/06/17 1800 01/06/17 1830 01/06/17 2125 01/07/17 0449  BP: 138/63 136/78 135/82 130/76  Pulse: (!) 42 (!) 53 99 85  Resp: 19 (!) 22 18 18   Temp:   98.5 F (36.9 C)  98.3 F (36.8 C)  TempSrc:   Oral Oral  SpO2: 95% 97% 100% 98%  Weight:   72 kg (158 lb 11.7 oz)   Height:   5\' 10"  (1.778 m)     Intake/Output Summary (Last 24 hours) at 01/07/2017 1104 Last data filed at 01/07/2017 1045 Gross per 24 hour  Intake 2280 ml  Output -  Net 2280 ml   Filed Weights   01/06/17 2125  Weight: 72 kg (158 lb 11.7 oz)    Examination:  General exam: Thin cachectic elderly male lying on bed Respiratory system: Clear to auscultation. Respiratory effort normal. No wheezing or crackle Cardiovascular system: S1 & S2 heard, RRR.  No pedal edema. Gastrointestinal system: Abdomen is nondistended, soft and nontender. Normal bowel sounds heard. Central nervous system: Alert and oriented. No focal neurological deficits. Extremities: Symmetric 5 x 5 power. Skin: No rashes, lesions or ulcers Psychiatry: Judgement and insight appear normal. Mood & affect appropriate.     Data Reviewed: I have personally reviewed following labs and imaging studies  CBC: Recent Labs  Lab 01/03/17 1037 01/05/17 1016 01/06/17 1739 01/06/17 1746  WBC 15.9* 13.5* 14.9*  --   NEUTROABS 12.5* 10.4* 11.3*  --   HGB 11.5* 10.3* 9.8* 9.2*  HCT 34.6* 31.2* 29.0* 27.0*  MCV 89.9 91.0 89.5  --   PLT 326 257 274  --    Basic Metabolic Panel: Recent Labs  Lab 01/03/17 1037 01/04/17 1157 01/05/17 1016 01/06/17 1739 01/06/17 1746  NA 138 140 140 136 139  K 2.9* 2.8* 3.4* 3.4* 3.5  CL  --   --   --  108 105  CO2 25 26  20* 22  --   GLUCOSE 135 82 94 60* 58*  BUN 25.0 29.4* 28.0* 19 16  CREATININE 1.0 1.1 0.9 0.71 0.70  CALCIUM 9.2 9.2 8.9 8.2*  --   MG  --   --  2.1 1.8  --    GFR: Estimated Creatinine Clearance: 72.5 mL/min (by C-G formula based on SCr of 0.7 mg/dL). Liver Function Tests: Recent Labs  Lab 01/03/17 1037 01/04/17 1157 01/05/17 1016 01/06/17 1739  AST 15 21 54* 70*  ALT 14 15 28  43  ALKPHOS 73 74 86 67  BILITOT 1.52* 1.15 0.74 0.8  PROT 6.0* 6.0* 5.7*  5.3*  ALBUMIN 2.8* 2.6* 2.4* 2.3*   Recent Labs  Lab 01/06/17 1739  LIPASE 35   No results for input(s): AMMONIA in the last 168 hours. Coagulation Profile: No results for input(s): INR, PROTIME in the last 168 hours. Cardiac Enzymes: No results for input(s): CKTOTAL, CKMB, CKMBINDEX, TROPONINI in the last 168 hours. BNP (last 3 results) No results for input(s): PROBNP in the last 8760 hours. HbA1C: No results for input(s): HGBA1C in the last 72 hours. CBG: Recent Labs  Lab 01/06/17 1722 01/06/17 2152 01/06/17 2253 01/07/17 0749  GLUCAP 57* 67 92 183*   Lipid Profile: No results for input(s): CHOL, HDL, LDLCALC, TRIG, CHOLHDL, LDLDIRECT in the last 72 hours. Thyroid Function Tests: No results for input(s): TSH, T4TOTAL, FREET4, T3FREE, THYROIDAB in the last 72 hours. Anemia Panel: No results for input(s): VITAMINB12, FOLATE, FERRITIN, TIBC, IRON, RETICCTPCT in the last 72 hours. Sepsis Labs: No results for input(s): PROCALCITON, LATICACIDVEN in the last 168 hours.  Recent Results (from the past 240 hour(s))  Culture, Blood     Status: None (Preliminary result)   Collection Time: 01/04/17 11:57 AM  Result Value Ref Range Status   BLOOD CULTURE, ROUTINE Preliminary report  Preliminary   Organism ID, Bacteria Comment  Preliminary    Comment: No growth in 36 - 48 hours.  Culture, Blood     Status: None (Preliminary result)   Collection Time: 01/04/17 11:57 AM  Result Value Ref Range Status   BLOOD CULTURE, ROUTINE Preliminary report  Preliminary   Organism ID, Bacteria Comment  Preliminary    Comment: No growth in 36 - 48 hours.  TECHNOLOGIST REVIEW     Status: None   Collection Time: 01/05/17 10:16 AM  Result Value Ref Range Status   Technologist Review Rare meta, few ovalos  Final  Stool culture (children & immunocomp patients)     Status: None (Preliminary result)   Collection Time: 01/05/17 11:15 AM  Result Value Ref Range Status   Salmonella/Shigella Screen  PENDING  Incomplete   Campylobacter Culture PENDING  Incomplete   E coli, Shiga toxin Assay Negative Negative Final    Comment: (NOTE) Performed At: [facility] [place], [place] [id number] Rush Farmer MD LG:[id number]          Radiology Studies: Ct Abdomen Pelvis W Contrast  Result Date: 01/05/2017 CLINICAL DATA:  83 year old male with history of pancreatic carcinoma, with diagnosis [place]. Given history of oncology with Dr. Truitt Merle, and CT from outside institutions. EXAM: CT ABDOMEN AND PELVIS WITH CONTRAST TECHNIQUE: Multidetector CT imaging of the abdomen and pelvis was performed using the standard protocol following bolus administration of intravenous contrast. CONTRAST:  147mL ISOVUE-300 IOPAMIDOL (ISOVUE-300) INJECTION 61% COMPARISON:  No comparison available FINDINGS: Lower chest: No acute abnormality. Hepatobiliary: Relatively uniform attenuation of the liver parenchyma. There is a small  hypodense focus adjacent to the falciform ligament which measures 12 mm on axial CT images, best seen on the delayed images. Unremarkable appearance of gallbladder. Mild dilation of the proximal intrahepatic biliary ducts and the common bile duct. The common bile duct abruptly terminates within the head of the pancreas, near the cut off sign of the pancreatic duct at the pancreatic head. Pancreas: Hypoenhancing mass involving the uncinate process and pancreatic head. The axial measurements estimate 5.1 cm x 3.6 cm, just anterior to the transverse duodenum. The mass is inseparable from the proximal superior mesenteric artery, with obliteration of the fat planes. The inferior/ posterior pancreatic mass inseparable from the duodenum. The right aspects of the mass extend towards the pancreatic head where there is a cut off of the pancreatic duct and the common bile duct. Distal pancreatic duct is dilated, measuring as low grade as 4 mm in diameter. No comparison CT imaging  available. Small lymph nodes in the gastro pathic ligament. Borderline enlarged portacaval node measuring 8 mm. Rounded soft tissue nodule measures 11 mm just inferior to the pancreatic neck. Portal vein, splenic vein, superior mesenteric vein, and inferior mesenteric vein patent on the study. Spleen: Unremarkable spleen Adrenals/Urinary Tract: Unremarkable adrenal glands. Bilateral kidneys unremarkable with hydronephrosis. Small nonobstructive stone at the superior collecting system of the right kidney. Unremarkable course the visualized bilateral ureters. Urinary bladder with demonstrate circumferential thickening. Stomach/Bowel: Unremarkable stomach. Duodenum is attenuated at the transverse portion, which is inseparable from pancreatic mass. Proximal small bowel unremarkable with no distention. Enteric contrast reaches distal small bowel. Normal appendix. Colonic diverticula. No evidence of associated inflammatory changes. Vascular/Lymphatic: Vascular calcifications of the aorta extending into the bilateral iliac system. Bilateral iliac and proximal femoral vasculature patent. Calcifications of the proximal bilateral renal arteries, with likely 50% stenosis bilaterally. Vascular calcifications at the origin of the celiac artery, superior mesenteric artery, and inferior mesenteric artery. Reproductive: Transverse diameter of the prostate estimated at 6.2 cm. Other: Fat containing umbilical hernia Musculoskeletal: No acute displaced fracture. Degenerative changes of the lumbar spine. IMPRESSION: Hypoenhancing mass of the uncinate process, compatible with adenocarcinoma. CT is positive for vascular involvement of the proximal superior mesenteric artery at the posterior margin. Comparison with outside CT imaging may be useful. Pancreatic mass is inseparable from the transverse duodenum, with narrowing of the lumen of the transverse duodenum. At least stenosis if not occlusion of the pancreatic duct, with questionable  obstruction of the common bile duct. There is associated mild intrahepatic biliary ductal dilatation. Again, comparison with outside CT imaging may be useful to evaluate for progression. Question of small metastatic implant of the left liver adjacent to falciform ligament. Alternatively this could represent focal fat. MRI may be considered, if warranted. Aortic atherosclerosis, mesenteric atherosclerosis, and bilateral renal artery atherosclerosis. Aortic Atherosclerosis (ICD10-I70.0). Electronically Signed   By: Corrie Mckusick D.O.   On: 01/05/2017 16:10        Scheduled Meds: . atorvastatin  5 mg Oral q1800  . calcium-vitamin D   Oral Daily  . enoxaparin (LOVENOX) injection  40 mg Subcutaneous Q24H  . insulin aspart  0-15 Units Subcutaneous TID WC  . insulin aspart  0-5 Units Subcutaneous QHS  . niacin  750 mg Oral BID  . pantoprazole  40 mg Oral Daily  . pyridOXINE  100 mg Oral Daily   Continuous Infusions: . dextrose 5 % and 0.45 % NaCl with KCl 20 mEq/L 75 mL/hr at 01/07/17 1028     LOS: 1 day  Kikue Gerhart Tanna Furry, MD [facility] Pager [phone]  If 7PM-7AM, please contact night-coverage [email or web address] Password [email or web address] 01/07/2017, 11:04 AM

## 2017-01-07 NOTE — Progress Notes (Signed)
Cody Mckay   DOB:10/07/1932   WF#:[id number]   UKG#:[id number]  ONCOLOGY F/U NOTE  Subjective: Patient is known to me, under my care for his recently diagnosed pancreatic cancer.  He presented with worsening nausea, vomiting, poor p.o. Intake and dehydration.  He was admitted from ED yesterday for further management.   Objective:  Vitals:   01/06/17 2125 01/07/17 0449  BP: 135/82 130/76  Pulse: 99 85  Resp: 18 18  Temp: 98.5 F (36.9 C) 98.3 F (36.8 C)  SpO2: 100% 98%    Body mass index is 22.78 kg/m.  Intake/Output Summary (Last 24 hours) at 01/07/2017 0819 Last data filed at 01/07/2017 0600 Gross per 24 hour  Intake 2080 ml  Output -  Net 2080 ml     Sclerae unicteric  Oropharynx clear  No peripheral adenopathy  Lungs clear -- no rales or rhonchi  Heart regular rate and rhythm  Abdomen benign  MSK no focal spinal tenderness, no peripheral edema  Neuro nonfocal    CBG (last 3)  Recent Labs    01/06/17 2152 01/06/17 2253 01/07/17 0749  GLUCAP 67 92 183*     Labs:  Lab Results  Component Value Date   WBC 14.9 (H) 01/06/2017   HGB 9.2 (L) 01/06/2017   HCT 27.0 (L) 01/06/2017   MCV 89.5 01/06/2017   PLT 274 01/06/2017   NEUTROABS 11.3 (H) 01/06/2017   CMP Latest Ref Rng & Units 01/06/2017 01/06/2017 01/05/2017  Glucose 65 - 99 mg/dL 58(L) 60(L) 94  BUN 6 - 20 mg/dL 16 19 28.0(H)  Creatinine 0.61 - 1.24 mg/dL 0.70 0.71 0.9  Sodium 135 - 145 mmol/L 139 136 140  Potassium 3.5 - 5.1 mmol/L 3.5 3.4(L) 3.4(L)  Chloride 101 - 111 mmol/L 105 108 -  CO2 22 - 32 mmol/L - 22 20(L)  Calcium 8.9 - 10.3 mg/dL - 8.2(L) 8.9  Total Protein 6.5 - 8.1 g/dL - 5.3(L) 5.7(L)  Total Bilirubin 0.3 - 1.2 mg/dL - 0.8 0.74  Alkaline Phos 38 - 126 U/L - 67 86  AST 15 - 41 U/L - 70(H) 54(H)  ALT 17 - 63 U/L - 43 28     Urine Studies No results for input(s): UHGB, CRYS in the last 72 hours.  Invalid input(s): UACOL, UAPR, USPG, UPH, UTP, UGL, UKET, UBIL, UNIT, UROB, [place],  UEPI, UWBC, Duwayne Heck [place], [place]  Basic Metabolic Panel: Recent Labs  Lab 01/03/17 1037 01/04/17 1157 01/05/17 1016 01/06/17 1739 01/06/17 1746  NA 138 140 140 136 139  K 2.9* 2.8* 3.4* 3.4* 3.5  CL  --   --   --  108 105  CO2 25 26 20* 22  --   GLUCOSE 135 82 94 60* 58*  BUN 25.0 29.4* 28.0* 19 16  CREATININE 1.0 1.1 0.9 0.71 0.70  CALCIUM 9.2 9.2 8.9 8.2*  --   MG  --   --  2.1 1.8  --    GFR Estimated Creatinine Clearance: 72.5 mL/min (by C-G formula based on SCr of 0.7 mg/dL). Liver Function Tests: Recent Labs  Lab 01/03/17 1037 01/04/17 1157 01/05/17 1016 01/06/17 1739  AST 15 21 54* 70*  ALT 14 15 28  43  ALKPHOS 73 74 86 67  BILITOT 1.52* 1.15 0.74 0.8  PROT 6.0* 6.0* 5.7* 5.3*  ALBUMIN 2.8* 2.6* 2.4* 2.3*   Recent Labs  Lab 01/06/17 1739  LIPASE 35   No results for input(s): AMMONIA in the last 168 hours. Coagulation  profile No results for input(s): INR, PROTIME in the last 168 hours.  CBC: Recent Labs  Lab 01/03/17 1037 01/05/17 1016 01/06/17 1739 01/06/17 1746  WBC 15.9* 13.5* 14.9*  --   NEUTROABS 12.5* 10.4* 11.3*  --   HGB 11.5* 10.3* 9.8* 9.2*  HCT 34.6* 31.2* 29.0* 27.0*  MCV 89.9 91.0 89.5  --   PLT 326 257 274  --    Cardiac Enzymes: No results for input(s): CKTOTAL, CKMB, CKMBINDEX, TROPONINI in the last 168 hours. BNP: Invalid input(s): POCBNP CBG: Recent Labs  Lab 01/06/17 1722 01/06/17 2152 01/06/17 2253 01/07/17 0749  GLUCAP 57* 67 92 183*   D-Dimer No results for input(s): DDIMER in the last 72 hours. Hgb A1c No results for input(s): HGBA1C in the last 72 hours. Lipid Profile No results for input(s): CHOL, HDL, LDLCALC, TRIG, CHOLHDL, LDLDIRECT in the last 72 hours. Thyroid function studies No results for input(s): TSH, T4TOTAL, T3FREE, THYROIDAB in the last 72 hours.  Invalid input(s): FREET3 Anemia work up No results for input(s): VITAMINB12, FOLATE, FERRITIN, TIBC, IRON, RETICCTPCT in the last 72  hours. Microbiology Recent Results (from the past 240 hour(s))  Culture, Blood     Status: None (Preliminary result)   Collection Time: 01/04/17 11:57 AM  Result Value Ref Range Status   BLOOD CULTURE, ROUTINE Preliminary report  Preliminary   Organism ID, Bacteria Comment  Preliminary    Comment: No growth in 36 - 48 hours.  Culture, Blood     Status: None (Preliminary result)   Collection Time: 01/04/17 11:57 AM  Result Value Ref Range Status   BLOOD CULTURE, ROUTINE Preliminary report  Preliminary   Organism ID, Bacteria Comment  Preliminary    Comment: No growth in 36 - 48 hours.  TECHNOLOGIST REVIEW     Status: None   Collection Time: 01/05/17 10:16 AM  Result Value Ref Range Status   Technologist Review Rare meta, few ovalos  Final      Studies:  Ct Abdomen Pelvis W Contrast  Result Date: 01/05/2017 CLINICAL DATA:  83 year old male with history of pancreatic carcinoma, with diagnosis [place]. Given history of oncology with Dr. Truitt Merle, and CT from outside institutions. EXAM: CT ABDOMEN AND PELVIS WITH CONTRAST TECHNIQUE: Multidetector CT imaging of the abdomen and pelvis was performed using the standard protocol following bolus administration of intravenous contrast. CONTRAST:  118mL ISOVUE-300 IOPAMIDOL (ISOVUE-300) INJECTION 61% COMPARISON:  No comparison available FINDINGS: Lower chest: No acute abnormality. Hepatobiliary: Relatively uniform attenuation of the liver parenchyma. There is a small hypodense focus adjacent to the falciform ligament which measures 12 mm on axial CT images, best seen on the delayed images. Unremarkable appearance of gallbladder. Mild dilation of the proximal intrahepatic biliary ducts and the common bile duct. The common bile duct abruptly terminates within the head of the pancreas, near the cut off sign of the pancreatic duct at the pancreatic head. Pancreas: Hypoenhancing mass involving the uncinate process and pancreatic head. The axial  measurements estimate 5.1 cm x 3.6 cm, just anterior to the transverse duodenum. The mass is inseparable from the proximal superior mesenteric artery, with obliteration of the fat planes. The inferior/ posterior pancreatic mass inseparable from the duodenum. The right aspects of the mass extend towards the pancreatic head where there is a cut off of the pancreatic duct and the common bile duct. Distal pancreatic duct is dilated, measuring as low grade as 4 mm in diameter. No comparison CT imaging available. Small lymph nodes in the  gastro pathic ligament. Borderline enlarged portacaval node measuring 8 mm. Rounded soft tissue nodule measures 11 mm just inferior to the pancreatic neck. Portal vein, splenic vein, superior mesenteric vein, and inferior mesenteric vein patent on the study. Spleen: Unremarkable spleen Adrenals/Urinary Tract: Unremarkable adrenal glands. Bilateral kidneys unremarkable with hydronephrosis. Small nonobstructive stone at the superior collecting system of the right kidney. Unremarkable course the visualized bilateral ureters. Urinary bladder with demonstrate circumferential thickening. Stomach/Bowel: Unremarkable stomach. Duodenum is attenuated at the transverse portion, which is inseparable from pancreatic mass. Proximal small bowel unremarkable with no distention. Enteric contrast reaches distal small bowel. Normal appendix. Colonic diverticula. No evidence of associated inflammatory changes. Vascular/Lymphatic: Vascular calcifications of the aorta extending into the bilateral iliac system. Bilateral iliac and proximal femoral vasculature patent. Calcifications of the proximal bilateral renal arteries, with likely 50% stenosis bilaterally. Vascular calcifications at the origin of the celiac artery, superior mesenteric artery, and inferior mesenteric artery. Reproductive: Transverse diameter of the prostate estimated at 6.2 cm. Other: Fat containing umbilical hernia Musculoskeletal: No acute  displaced fracture. Degenerative changes of the lumbar spine. IMPRESSION: Hypoenhancing mass of the uncinate process, compatible with adenocarcinoma. CT is positive for vascular involvement of the proximal superior mesenteric artery at the posterior margin. Comparison with outside CT imaging may be useful. Pancreatic mass is inseparable from the transverse duodenum, with narrowing of the lumen of the transverse duodenum. At least stenosis if not occlusion of the pancreatic duct, with questionable obstruction of the common bile duct. There is associated mild intrahepatic biliary ductal dilatation. Again, comparison with outside CT imaging may be useful to evaluate for progression. Question of small metastatic implant of the left liver adjacent to falciform ligament. Alternatively this could represent focal fat. MRI may be considered, if warranted. Aortic atherosclerosis, mesenteric atherosclerosis, and bilateral renal artery atherosclerosis. Aortic Atherosclerosis (ICD10-I70.0). Electronically Signed   By: Corrie Mckusick D.O.   On: 01/05/2017 16:10    Assessment: 83 y.o. male with a history of abnormal EKG, right bundle branch block, bleeding gastric ulcer, Diverticulosis, DM, and HTN, who was recently diagnosed with pancreatic head adenocarcinoma   1. 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  Partial duodenal obstruction secondary to #1 3.  Nausea, vomiting, dehydration 4. DM 5. HTN 6. GERD 7.  Severe protein and calorie malnutrition 8. DNR/DNI   Recommendations -I reviewed his CT scan findings with patient in detail, unfortunately his pancreatic head tumor has invaded the duodenum and causes partial obstruction.  I have discussed with GI Dr. Ardis Hughs who does not think this is feasible for endoscopic stenting -We discussed with the option of surgical bypass, versus J-tube placement, pt is interested in J-tube. I have discussed with general surgeon Dr.  Barry Dienes, she will see patient today. -Continue supportive care with IV fluids and symptom management -Patient agreed to DNR and DNI -He was scheduled for chemo today, will certainly cancel it.  Future treatment will be discussed after his surgery, the goal of therapy is palliative and supportive care, he may not be able to tolerate much chemo. Pt understands that.  -I will f/u   Truitt Merle, MD 01/07/2017  8:19 AM

## 2017-01-08 ENCOUNTER — Inpatient Hospital Stay (HOSPITAL_COMMUNITY): Payer: Medicare Other

## 2017-01-08 DIAGNOSIS — R001 Bradycardia, unspecified: Secondary | ICD-10-CM

## 2017-01-08 DIAGNOSIS — R9431 Abnormal electrocardiogram [ECG] [EKG]: Secondary | ICD-10-CM

## 2017-01-08 LAB — GLUCOSE, CAPILLARY
GLUCOSE-CAPILLARY: 169 mg/dL — AB (ref 65–99)
GLUCOSE-CAPILLARY: 125 mg/dL — AB (ref 65–99)
Glucose-Capillary: 229 mg/dL — ABNORMAL HIGH (ref 65–99)
Glucose-Capillary: 99 mg/dL (ref 65–99)

## 2017-01-08 LAB — ECHOCARDIOGRAM COMPLETE
HEIGHTINCHES: 70 in
WEIGHTICAEL: 2539.7 [oz_av]

## 2017-01-08 LAB — TROPONIN I: Troponin I: <0.03 ng/mL (ref <0.03)

## 2017-01-08 MED ORDER — SODIUM CHLORIDE 0.9% FLUSH
10.0000 mL | INTRAVENOUS | Status: DC | PRN
  Administered 2017-01-11: 10 mL
  Administered 2017-01-12: 20 mL
  Filled 2017-01-08 (×2): qty 40

## 2017-01-08 MED ORDER — CALCIUM CARBONATE-VITAMIN D 500-200 MG-UNIT PO TABS
1.0000 | ORAL_TABLET | Freq: Every day | ORAL | Status: DC
  Administered 2017-01-08: 1 via ORAL
  Filled 2017-01-08 (×2): qty 1

## 2017-01-08 MED ORDER — SODIUM CHLORIDE 0.9 % IV BOLUS (SEPSIS)
1000.0000 mL | Freq: Once | INTRAVENOUS | Status: AC
  Administered 2017-01-08: 1000 mL via INTRAVENOUS

## 2017-01-08 MED ORDER — POTASSIUM CHLORIDE IN NACL 20-0.9 MEQ/L-% IV SOLN
INTRAVENOUS | Status: DC
  Administered 2017-01-08 – 2017-01-09 (×5): via INTRAVENOUS
  Administered 2017-01-10: 125 mL via INTRAVENOUS
  Administered 2017-01-10: 07:00:00 via INTRAVENOUS
  Filled 2017-01-08 (×9): qty 1000

## 2017-01-08 NOTE — Progress Notes (Addendum)
PROGRESS NOTE    Cody Mckay  TDV:[id number] DOB: 1932-09-27 DOA: 01/06/2017 PCP: Patient, No Pcp Per   Brief Narrative: 83 y.o. 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  Assessment & Plan:   #Severe protein calorie malnutrition, dehydration due to poor intake in the setting of pancreatic malignancy: -Evaluated by oncologist and general surgery.  Plan for G-tube placement probably on Monday.  Per now continue IV fluid, monitor labs and dietary referral.  Continue supportive care.  #Pancreatic cancer, adenocarcinoma in head of pancreas: Unresectable.  There is partial duodenal obstruction secondary due to tumor.  As per oncology, it was reviewed with GI Dr. Ardis Hughs, who does not think this is feasible for endoscopic stenting. -Continue supportive care, symptom management.  #Dizziness likely contributed by orthostatic hypotension and bradycardia: Patient with dizziness last night.  Checked orthostatic blood pressure this morning which is positive.  Order 1 L of IV fluid bolus.  Patient also with sinus bradycardia.  Check TSH level, echocardiogram.  Patient is not on nodal blocker.  Given history of malignancy, CT scan of head was ordered to rule out any intracranial metastasis.  CT scan of head negative for any acute finding.  Continue supportive car, PT OT evaluation.  I have discussed with the patient and he agreed with the plan.  I transfer patient to telemetry floor because of arrhythmia/Bradycardia.  #Type 2 diabetes: Continue sliding scale.  Monitor blood sugar level.  #Hypertension: Continue to monitor blood pressure.  Blood pressure level acceptable.  #Hyperlipidemia continue current medication.  #GERD: Continue PPI  #Persistent leukocytosis likely due to dehydration, malignancy: Patient has  no UTI, no pneumonia.  Continue IV fluid.  Monitor CBC.  DVT prophylaxis: Lovenox subcutaneous Code Status: DNR Family Communication: No family at bedside Disposition Plan: Currently admitted    Consultants:   Oncology  General surgery  Procedures: None Antimicrobials: None  Subjective: Seen and examined at bedside.  Overnight event noted.  Denies headache, dizziness, nausea vomiting chest pain.  Patient reported that he gets lightheadedness, dizziness intermittently for the last few months.  Objective: Vitals:   01/07/17 1839 01/07/17 2108 01/08/17 0519 01/08/17 1137  BP: 129/68 (!) 148/81 (!) 170/73 (!) 95/54  Pulse: 84 (!) 48 (!) 55 (!) 58  Resp: 18 17 16 16   Temp: 98 F (36.7 C) 98.2 F (36.8 C) 98.3 F (36.8 C) (!) 97.5 F (36.4 C)  TempSrc: Oral Oral Oral Temporal  SpO2: 98% 97% 98% 100%  Weight:      Height:        Intake/Output Summary (Last 24 hours) at 01/08/2017 1212 Last data filed at 01/08/2017 0539 Gross per 24 hour  Intake 1773.75 ml  Output 350 ml  Net 1423.75 ml   Filed Weights   01/06/17 2125  Weight: 72 kg (158 lb 11.7 oz)    Examination:  General exam: Thin cachectic elderly male lying in bed comfortable, not in distress Respiratory system: Clear bilateral, respiratory for normal.  No wheezing or crackle cardiovascular system: Regular rate rhythm S1-S2 normal.  No pedal edema. Gastrointestinal system: Abdomen soft, nontender, nondistended.  Bowel sounds positive. Central nervous system: Alert and oriented. no focal neurological deficit.Marland Kitchen Extremities: Symmetric 5 x 5 power.  Unchanged Skin: No rashes, lesions or ulcers Psychiatry: Judgement and insight appear normal. Mood & affect appropriate.     Data Reviewed: I  have personally reviewed following labs and imaging studies  CBC: Recent Labs  Lab 01/03/17 1037 01/05/17 1016 01/06/17 1739 01/06/17 1746 01/07/17 1040  WBC 15.9* 13.5* 14.9*  --  14.4*  NEUTROABS 12.5* 10.4* 11.3*   --   --   HGB 11.5* 10.3* 9.8* 9.2* 9.4*  HCT 34.6* 31.2* 29.0* 27.0* 28.8*  MCV 89.9 91.0 89.5  --  90.0  PLT 326 257 274  --  532   Basic Metabolic Panel: Recent Labs  Lab 01/03/17 1037 01/04/17 1157 01/05/17 1016 01/06/17 1739 01/06/17 1746 01/07/17 1040  NA 138 140 140 136 139 139  K 2.9* 2.8* 3.4* 3.4* 3.5 3.4*  CL  --   --   --  108 105 110  CO2 25 26 20* 22  --  22  GLUCOSE 135 82 94 60* 58* 85  BUN 25.0 29.4* 28.0* 19 16 13   CREATININE 1.0 1.1 0.9 0.71 0.70 0.68  CALCIUM 9.2 9.2 8.9 8.2*  --  8.2*  MG  --   --  2.1 1.8  --   --    GFR: Estimated Creatinine Clearance: 72.5 mL/min (by C-G formula based on SCr of 0.68 mg/dL). Liver Function Tests: Recent Labs  Lab 01/03/17 1037 01/04/17 1157 01/05/17 1016 01/06/17 1739 01/07/17 1040  AST 15 21 54* 70* 52*  ALT 14 15 28  43 40  ALKPHOS 73 74 86 67 73  BILITOT 1.52* 1.15 0.74 0.8 0.6  PROT 6.0* 6.0* 5.7* 5.3* 5.3*  ALBUMIN 2.8* 2.6* 2.4* 2.3* 2.3*   Recent Labs  Lab 01/06/17 1739  LIPASE 35   No results for input(s): AMMONIA in the last 168 hours. Coagulation Profile: Recent Labs  Lab 01/07/17 1040  INR 1.41   Cardiac Enzymes: Recent Labs  Lab 01/07/17 2359  TROPONINI <0.03   BNP (last 3 results) No results for input(s): PROBNP in the last 8760 hours. HbA1C: No results for input(s): HGBA1C in the last 72 hours. CBG: Recent Labs  Lab 01/07/17 0749 01/07/17 1227 01/07/17 1733 01/07/17 2226 01/08/17 0735  GLUCAP 183* 88 163* 151* 229*   Lipid Profile: No results for input(s): CHOL, HDL, LDLCALC, TRIG, CHOLHDL, LDLDIRECT in the last 72 hours. Thyroid Function Tests: No results for input(s): TSH, T4TOTAL, FREET4, T3FREE, THYROIDAB in the last 72 hours. Anemia Panel: No results for input(s): VITAMINB12, FOLATE, FERRITIN, TIBC, IRON, RETICCTPCT in the last 72 hours. Sepsis Labs: No results for input(s): PROCALCITON, LATICACIDVEN in the last 168 hours.  Recent Results (from the past 240  hour(s))  Culture, Blood     Status: None (Preliminary result)   Collection Time: 01/04/17 11:57 AM  Result Value Ref Range Status   BLOOD CULTURE, ROUTINE Preliminary report  Preliminary   Organism ID, Bacteria Comment  Preliminary    Comment: No growth in 36 - 48 hours.  Culture, Blood     Status: None (Preliminary result)   Collection Time: 01/04/17 11:57 AM  Result Value Ref Range Status   BLOOD CULTURE, ROUTINE Preliminary report  Preliminary   Organism ID, Bacteria Comment  Preliminary    Comment: No growth in 36 - 48 hours.  TECHNOLOGIST REVIEW     Status: None   Collection Time: 01/05/17 10:16 AM  Result Value Ref Range Status   Technologist Review Rare meta, few ovalos  Final  Stool culture (children & immunocomp patients)     Status: None (Preliminary result)   Collection Time: 01/05/17 11:15 AM  Result Value Ref Range Status  Salmonella/Shigella Screen Final report  Final   Campylobacter Culture PENDING  Incomplete   E coli, Shiga toxin Assay Negative Negative Final    Comment: (NOTE) Performed At: [facility] [place], [place] [id number] Rush Farmer MD QM:[id number]   STOOL CULTURE REFLEX - RSASHR     Status: None   Collection Time: 01/05/17 11:15 AM  Result Value Ref Range Status   Stool Culture result 1 (RSASHR) Comment  Final    Comment: (NOTE) No Salmonella or Shigella recovered. Performed At: [facility] [place], [place] [id number] Rush Farmer MD [id number]          Radiology Studies: Ct Head Wo Contrast  Result Date: 01/08/2017 CLINICAL DATA:  Episodic vertigo. Pancreatic cancer being treated with chemotherapy. Diabetes and hypertension. EXAM: CT HEAD WITHOUT CONTRAST TECHNIQUE: Contiguous axial images were obtained from the base of the skull through the vertex without intravenous contrast. COMPARISON:  None. FINDINGS: Brain: Generalized brain atrophy. Old small vessel cerebellar infarctions.  Chronic small-vessel ischemic changes of the thalami, basal ganglia and hemispheric white matter. No cortical or large vessel territory infarction. No mass lesion, hemorrhage, hydrocephalus or extra-axial collection. Vascular: There is atherosclerotic calcification of the major vessels at the base of the brain. Skull: Normal Sinuses/Orbits: Clear/normal Other: Benign choroid calcification and pineal calcification. IMPRESSION: No acute finding by CT. Generalized atrophy. Chronic small-vessel ischemic changes affecting the cerebellum an the cerebral hemispheres as outlined above. Electronically Signed   By: Nelson Chimes M.D.   On: 01/08/2017 10:53        Scheduled Meds: . atorvastatin  5 mg Oral q1800  . calcium-vitamin D  1 tablet Oral Daily  . enoxaparin (LOVENOX) injection  40 mg Subcutaneous Q24H  . feeding supplement (GLUCERNA SHAKE)  237 mL Oral TID BM  . insulin aspart  0-15 Units Subcutaneous TID WC  . insulin aspart  0-5 Units Subcutaneous QHS  . niacin  750 mg Oral BID  . pantoprazole  40 mg Oral Daily  . pyridOXINE  100 mg Oral Daily   Continuous Infusions: . dextrose 5 % and 0.45 % NaCl with KCl 20 mEq/L 75 mL/hr at 01/07/17 2244  . sodium chloride       LOS: 2 days    Dron Tanna Furry, MD [facility] Pager [phone]  If 7PM-7AM, please contact night-coverage [email or web address] Password [email or web address] 01/08/2017, 12:12 PM

## 2017-01-08 NOTE — Progress Notes (Signed)
  Echocardiogram 2D Echocardiogram has been performed.  Merrie Roof F 01/08/2017, 3:59 PM

## 2017-01-08 NOTE — Progress Notes (Signed)
Assessment Active Problems:   Locally advanced pancreatic adenocarcinoma (HCC) with partial duodenal obstruction   Dehydration   Generalized weakness   Severe protein-calorie malnutrition (HCC)   Leukocytosis-? etiology   Plan:  Tentatively plan operative placement of feeding jejunostomy early next week.  Has persistent leukocytosis and this should be evaluated.   LOS: 2 days        Chief Complaint/Subjective: Some n/v overnight  Objective: Vital signs in last 24 hours: Temp:  [98 F (36.7 C)-98.3 F (36.8 C)] 98.3 F (36.8 C) (12/08 0519) Pulse Rate:  [48-84] 55 (12/08 0519) Resp:  [16-18] 16 (12/08 0519) BP: (129-170)/(68-81) 170/73 (12/08 0519) SpO2:  [97 %-98 %] 98 % (12/08 0519) Last BM Date: 01/07/17  Intake/Output from previous day: 12/07 0701 - 12/08 0700 In: 1973.8 [P.O.:200; I.V.:1773.8] Out: 351 [Urine:350; Stool:1] Intake/Output this shift: No intake/output data recorded.  PE: General- In NAD.  Awake and alert. Abdomen-soft, not tender  Lab Results:  Recent Labs    01/06/17 1739 01/06/17 1746 01/07/17 1040  WBC 14.9*  --  14.4*  HGB 9.8* 9.2* 9.4*  HCT 29.0* 27.0* 28.8*  PLT 274  --  304   BMET Recent Labs    01/06/17 1739 01/06/17 1746 01/07/17 1040  NA 136 139 139  K 3.4* 3.5 3.4*  CL 108 105 110  CO2 22  --  22  GLUCOSE 60* 58* 85  BUN 19 16 13   CREATININE 0.71 0.70 0.68  CALCIUM 8.2*  --  8.2*   PT/INR Recent Labs    01/07/17 1040  LABPROT 17.2*  INR 1.41   Comprehensive Metabolic Panel:    Component Value Date/Time   NA 139 01/07/2017 1040   NA 139 01/06/2017 1746   NA 140 01/05/2017 1016   NA 140 01/04/2017 1157   K 3.4 (L) 01/07/2017 1040   K 3.5 01/06/2017 1746   K 3.4 (L) 01/05/2017 1016   K 2.8 (LL) 01/04/2017 1157   CL 110 01/07/2017 1040   CL 105 01/06/2017 1746   CO2 22 01/07/2017 1040   CO2 22 01/06/2017 1739   CO2 20 (L) 01/05/2017 1016   CO2 26 01/04/2017 1157   BUN 13 01/07/2017 1040   BUN 16  01/06/2017 1746   BUN 28.0 (H) 01/05/2017 1016   BUN 29.4 (H) 01/04/2017 1157   CREATININE 0.68 01/07/2017 1040   CREATININE 0.70 01/06/2017 1746   CREATININE 0.9 01/05/2017 1016   CREATININE 1.1 01/04/2017 1157   GLUCOSE 85 01/07/2017 1040   GLUCOSE 58 (L) 01/06/2017 1746   GLUCOSE 94 01/05/2017 1016   GLUCOSE 82 01/04/2017 1157   CALCIUM 8.2 (L) 01/07/2017 1040   CALCIUM 8.2 (L) 01/06/2017 1739   CALCIUM 8.9 01/05/2017 1016   CALCIUM 9.2 01/04/2017 1157   AST 52 (H) 01/07/2017 1040   AST 70 (H) 01/06/2017 1739   AST 54 (H) 01/05/2017 1016   AST 21 01/04/2017 1157   ALT 40 01/07/2017 1040   ALT 43 01/06/2017 1739   ALT 28 01/05/2017 1016   ALT 15 01/04/2017 1157   ALKPHOS 73 01/07/2017 1040   ALKPHOS 67 01/06/2017 1739   ALKPHOS 86 01/05/2017 1016   ALKPHOS 74 01/04/2017 1157   BILITOT 0.6 01/07/2017 1040   BILITOT 0.8 01/06/2017 1739   BILITOT 0.74 01/05/2017 1016   BILITOT 1.15 01/04/2017 1157   PROT 5.3 (L) 01/07/2017 1040   PROT 5.3 (L) 01/06/2017 1739   PROT 5.7 (L) 01/05/2017 1016   PROT 6.0 (  L) 01/04/2017 1157   ALBUMIN 2.3 (L) 01/07/2017 1040   ALBUMIN 2.3 (L) 01/06/2017 1739   ALBUMIN 2.4 (L) 01/05/2017 1016   ALBUMIN 2.6 (L) 01/04/2017 1157     Studies/Results: No results found.  Anti-infectives: Anti-infectives (From admission, onward)   None       Jackolyn Confer 01/08/2017

## 2017-01-09 DIAGNOSIS — R42 Dizziness and giddiness: Secondary | ICD-10-CM

## 2017-01-09 LAB — BASIC METABOLIC PANEL
Anion gap: 11 (ref 5–15)
BUN: 7 mg/dL (ref 6–20)
CHLORIDE: 104 mmol/L (ref 101–111)
CO2: 23 mmol/L (ref 22–32)
CREATININE: 0.58 mg/dL — AB (ref 0.61–1.24)
Calcium: 8.2 mg/dL — ABNORMAL LOW (ref 8.9–10.3)
Glucose, Bld: 130 mg/dL — ABNORMAL HIGH (ref 65–99)
Potassium: 3.4 mmol/L — ABNORMAL LOW (ref 3.5–5.1)
SODIUM: 138 mmol/L (ref 135–145)

## 2017-01-09 LAB — CBC
HCT: 32.8 % — ABNORMAL LOW (ref 39.0–52.0)
Hemoglobin: 10.7 g/dL — ABNORMAL LOW (ref 13.0–17.0)
MCH: 29.5 pg (ref 26.0–34.0)
MCHC: 32.6 g/dL (ref 30.0–36.0)
MCV: 90.4 fL (ref 78.0–100.0)
Platelets: 374 10*3/uL (ref 150–400)
RBC: 3.63 MIL/uL — ABNORMAL LOW (ref 4.22–5.81)
RDW: 14.9 % (ref 11.5–15.5)
WBC: 16.1 10*3/uL — ABNORMAL HIGH (ref 4.0–10.5)

## 2017-01-09 LAB — GLUCOSE, CAPILLARY
GLUCOSE-CAPILLARY: 127 mg/dL — AB (ref 65–99)
GLUCOSE-CAPILLARY: 141 mg/dL — AB (ref 65–99)
GLUCOSE-CAPILLARY: 127 mg/dL — AB (ref 65–99)
Glucose-Capillary: 143 mg/dL — ABNORMAL HIGH (ref 65–99)

## 2017-01-09 LAB — SURGICAL PCR SCREEN
MRSA, PCR: NEGATIVE
Staphylococcus aureus: NEGATIVE

## 2017-01-09 LAB — MAGNESIUM: Magnesium: 1.4 mg/dL — ABNORMAL LOW (ref 1.7–2.4)

## 2017-01-09 MED ORDER — POTASSIUM CHLORIDE CRYS ER 20 MEQ PO TBCR
40.0000 meq | EXTENDED_RELEASE_TABLET | Freq: Every day | ORAL | Status: DC
  Administered 2017-01-09: 40 meq via ORAL
  Filled 2017-01-09: qty 2

## 2017-01-09 MED ORDER — CEFAZOLIN (ANCEF) 1 G IV SOLR
1.0000 g | INTRAVENOUS | Status: DC
  Filled 2017-01-09: qty 1

## 2017-01-09 MED ORDER — MAGNESIUM SULFATE 2 GM/50ML IV SOLN
2.0000 g | Freq: Once | INTRAVENOUS | Status: AC
  Administered 2017-01-09: 2 g via INTRAVENOUS
  Filled 2017-01-09: qty 50

## 2017-01-09 NOTE — Progress Notes (Addendum)
Assessment Active Problems:   Locally advanced pancreatic adenocarcinoma (HCC) with partial duodenal obstruction   Dehydration   Generalized weakness   Severe protein-calorie malnutrition (HCC)   Leukocytosis-WBC continues to increase   Plan:  Tentatively plan operative placement of feeding jejunostomy tomorrow or Tuesday.  Procedure and risks discussed with him.  Risks include but are not limited to bleeding, infection, wound problems, leak, need for revision, diarrhea, anesthesia.  He seems to understand and would like to proceed.  Am still concerned about increasing WBC.   LOS: 3 days        Chief Complaint/Subjective: Keeping some solid food down.  One episode of n/v.  Objective: Vital signs in last 24 hours: Temp:  [97.5 F (36.4 C)-98.1 F (36.7 C)] 97.7 F (36.5 C) (12/09 0500) Pulse Rate:  [57-61] 61 (12/09 0500) Resp:  [16] 16 (12/09 0500) BP: (95-176)/(54-75) 176/69 (12/09 0500) SpO2:  [98 %-100 %] 98 % (12/09 0500) Last BM Date: 01/09/17  Intake/Output from previous day: 12/08 0701 - 12/09 0700 In: 2427.1 [P.O.:600; I.V.:1827.1] Out: 1000 [Urine:1000] Intake/Output this shift: Total I/O In: -  Out: 150 [Urine:150]  PE: General- In NAD.  Awake and alert. Abdomen-soft, not tender  Lab Results:  Recent Labs    01/07/17 1040 01/09/17 0447  WBC 14.4* 16.1*  HGB 9.4* 10.7*  HCT 28.8* 32.8*  PLT 304 374   BMET Recent Labs    01/07/17 1040 01/09/17 0447  NA 139 138  K 3.4* 3.4*  CL 110 104  CO2 22 23  GLUCOSE 85 130*  BUN 13 7  CREATININE 0.68 0.58*  CALCIUM 8.2* 8.2*   PT/INR Recent Labs    01/07/17 1040  LABPROT 17.2*  INR 1.41   Comprehensive Metabolic Panel:    Component Value Date/Time   NA 138 01/09/2017 0447   NA 139 01/07/2017 1040   NA 140 01/05/2017 1016   NA 140 01/04/2017 1157   K 3.4 (L) 01/09/2017 0447   K 3.4 (L) 01/07/2017 1040   K 3.4 (L) 01/05/2017 1016   K 2.8 (LL) 01/04/2017 1157   CL 104 01/09/2017 0447   CL 110 01/07/2017 1040   CO2 23 01/09/2017 0447   CO2 22 01/07/2017 1040   CO2 20 (L) 01/05/2017 1016   CO2 26 01/04/2017 1157   BUN 7 01/09/2017 0447   BUN 13 01/07/2017 1040   BUN 28.0 (H) 01/05/2017 1016   BUN 29.4 (H) 01/04/2017 1157   CREATININE 0.58 (L) 01/09/2017 0447   CREATININE 0.68 01/07/2017 1040   CREATININE 0.9 01/05/2017 1016   CREATININE 1.1 01/04/2017 1157   GLUCOSE 130 (H) 01/09/2017 0447   GLUCOSE 85 01/07/2017 1040   GLUCOSE 94 01/05/2017 1016   GLUCOSE 82 01/04/2017 1157   CALCIUM 8.2 (L) 01/09/2017 0447   CALCIUM 8.2 (L) 01/07/2017 1040   CALCIUM 8.9 01/05/2017 1016   CALCIUM 9.2 01/04/2017 1157   AST 52 (H) 01/07/2017 1040   AST 70 (H) 01/06/2017 1739   AST 54 (H) 01/05/2017 1016   AST 21 01/04/2017 1157   ALT 40 01/07/2017 1040   ALT 43 01/06/2017 1739   ALT 28 01/05/2017 1016   ALT 15 01/04/2017 1157   ALKPHOS 73 01/07/2017 1040   ALKPHOS 67 01/06/2017 1739   ALKPHOS 86 01/05/2017 1016   ALKPHOS 74 01/04/2017 1157   BILITOT 0.6 01/07/2017 1040   BILITOT 0.8 01/06/2017 1739   BILITOT 0.74 01/05/2017 1016   BILITOT 1.15 01/04/2017 1157   PROT  5.3 (L) 01/07/2017 1040   PROT 5.3 (L) 01/06/2017 1739   PROT 5.7 (L) 01/05/2017 1016   PROT 6.0 (L) 01/04/2017 1157   ALBUMIN 2.3 (L) 01/07/2017 1040   ALBUMIN 2.3 (L) 01/06/2017 1739   ALBUMIN 2.4 (L) 01/05/2017 1016   ALBUMIN 2.6 (L) 01/04/2017 1157     Studies/Results: Ct Head Wo Contrast  Result Date: 01/08/2017 CLINICAL DATA:  Episodic vertigo. Pancreatic cancer being treated with chemotherapy. Diabetes and hypertension. EXAM: CT HEAD WITHOUT CONTRAST TECHNIQUE: Contiguous axial images were obtained from the base of the skull through the vertex without intravenous contrast. COMPARISON:  None. FINDINGS: Brain: Generalized brain atrophy. Old small vessel cerebellar infarctions. Chronic small-vessel ischemic changes of the thalami, basal ganglia and hemispheric white matter. No cortical or large vessel  territory infarction. No mass lesion, hemorrhage, hydrocephalus or extra-axial collection. Vascular: There is atherosclerotic calcification of the major vessels at the base of the brain. Skull: Normal Sinuses/Orbits: Clear/normal Other: Benign choroid calcification and pineal calcification. IMPRESSION: No acute finding by CT. Generalized atrophy. Chronic small-vessel ischemic changes affecting the cerebellum an the cerebral hemispheres as outlined above. Electronically Signed   By: Nelson Chimes M.D.   On: 01/08/2017 10:53    Anti-infectives: Anti-infectives (From admission, onward)   None       Jackolyn Confer 01/09/2017

## 2017-01-09 NOTE — Progress Notes (Signed)
PROGRESS NOTE    Cody Mckay  IEP:[id number] DOB: [birth year] DOA: 01/06/2017 PCP: Patient, No Pcp Per   Brief Narrative: 82 y.o. 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  Assessment & Plan:   #Severe protein calorie malnutrition, dehydration due to poor intake in the setting of pancreatic malignancy: -Evaluated by oncologist and general surgery.  Plan for G-tube placement probably on Monday.  Per now continue IV fluid, monitor labs and dietary referral.  Continue supportive care. -Surgery consult appreciated.  #Pancreatic cancer, adenocarcinoma in head of pancreas: Unresectable.  There is partial duodenal obstruction secondary due to tumor.  As per oncology, it was reviewed with GI Dr. Ardis Hughs, who does not think this is feasible for endoscopic stenting. -Continue supportive care, symptom management.  #Dizziness likely contributed by orthostatic hypotension and bradycardia: Received IV fluid bolus and hydration with improvement in symptoms.  CT scan of head with no acute finding.  Echo unremarkable.  Continue supportive care.  Continue telemetry monitoring.  I have reviewed with the patient and he verbalized understanding. Follow-up TSH level.  #Type 2 diabetes: Continue sliding scale.  Monitor blood sugar level.  #Hypertension: Continue to monitor blood pressure.  Blood pressure level acceptable.  #Hyperlipidemia continue current medication.  #GERD: Continue PPI  #Persistent leukocytosis likely due to dehydration, malignancy: Patient has no UTI, no pneumonia.  -Patient has no sign of infection.  Continue IV fluid.  Monitor CBC in the morning.  Discontinue niacin, Lipitor as per patient's request.  DVT prophylaxis: Lovenox hold because of possible procedure tomorrow. Code Status: DNR Family  Communication: No family at bedside Disposition Plan: Currently admitted    Consultants:   Oncology  General surgery  Procedures: None Antimicrobials: None  Subjective: Seen and examined at bedside.  No nausea vomiting chest pain shortness of breath.  Still having very poor oral intake. Objective: Vitals:   01/08/17 0519 01/08/17 1137 01/08/17 2014 01/09/17 0500  BP: (!) 170/73 (!) 95/54 (!) 157/75 (!) 176/69  Pulse: (!) 55 (!) 58 (!) 57 61  Resp: 16 16 16 16   Temp: 98.3 F (36.8 C) (!) 97.5 F (36.4 C) 98.1 F (36.7 C) 97.7 F (36.5 C)  TempSrc: Oral Temporal Oral Oral  SpO2: 98% 100% 98% 98%  Weight:      Height:        Intake/Output Summary (Last 24 hours) at 01/09/2017 1208 Last data filed at 01/09/2017 1129 Gross per 24 hour  Intake 2477.09 ml  Output 1250 ml  Net 1227.09 ml   Filed Weights   01/06/17 2125  Weight: 72 kg (158 lb 11.7 oz)    Examination:  General exam: Thin cachectic male lying in bed comfortable Respiratory system: Clear bilateral, no wheezing. Cardiovascular system: Regular rate rhythm S1-S2 normal. Gastrointestinal system: Abdomen soft, nontender.  Bowel sounds positive. Central nervous system: Alert and oriented. no focal neurological deficit.. Skin: No rashes, lesions or ulcers Psychiatry: Judgement and insight appear normal. Mood & affect appropriate.     Data Reviewed: I have personally reviewed following labs and imaging studies  CBC: Recent Labs  Lab 01/03/17 1037 01/05/17 1016 01/06/17 1739 01/06/17 1746 01/07/17 1040 01/09/17 0447  WBC 15.9* 13.5* 14.9*  --  14.4* 16.1*  NEUTROABS 12.5* 10.4* 11.3*  --   --   --   HGB 11.5* 10.3* 9.8* 9.2* 9.4* 10.7*  HCT 34.6*  31.2* 29.0* 27.0* 28.8* 32.8*  MCV 89.9 91.0 89.5  --  90.0 90.4  PLT 326 257 274  --  304 035   Basic Metabolic Panel: Recent Labs  Lab 01/04/17 1157 01/05/17 1016 01/06/17 1739 01/06/17 1746 01/07/17 1040 01/09/17 0447  NA 140 140 136 139 139  138  K 2.8* 3.4* 3.4* 3.5 3.4* 3.4*  CL  --   --  108 105 110 104  CO2 26 20* 22  --  22 23  GLUCOSE 82 94 60* 58* 85 130*  BUN 29.4* 28.0* 19 16 13 7   CREATININE 1.1 0.9 0.71 0.70 0.68 0.58*  CALCIUM 9.2 8.9 8.2*  --  8.2* 8.2*  MG  --  2.1 1.8  --   --  1.4*   GFR: Estimated Creatinine Clearance: 72.5 mL/min (A) (by C-G formula based on SCr of 0.58 mg/dL (L)). Liver Function Tests: Recent Labs  Lab 01/03/17 1037 01/04/17 1157 01/05/17 1016 01/06/17 1739 01/07/17 1040  AST 15 21 54* 70* 52*  ALT 14 15 28  43 40  ALKPHOS 73 74 86 67 73  BILITOT 1.52* 1.15 0.74 0.8 0.6  PROT 6.0* 6.0* 5.7* 5.3* 5.3*  ALBUMIN 2.8* 2.6* 2.4* 2.3* 2.3*   Recent Labs  Lab 01/06/17 1739  LIPASE 35   No results for input(s): AMMONIA in the last 168 hours. Coagulation Profile: Recent Labs  Lab 01/07/17 1040  INR 1.41   Cardiac Enzymes: Recent Labs  Lab 01/07/17 2359  TROPONINI <0.03   BNP (last 3 results) No results for input(s): PROBNP in the last 8760 hours. HbA1C: No results for input(s): HGBA1C in the last 72 hours. CBG: Recent Labs  Lab 01/08/17 1208 01/08/17 1657 01/08/17 2123 01/09/17 0728 01/09/17 1127  GLUCAP 169* 125* 99 143* 127*   Lipid Profile: No results for input(s): CHOL, HDL, LDLCALC, TRIG, CHOLHDL, LDLDIRECT in the last 72 hours. Thyroid Function Tests: No results for input(s): TSH, T4TOTAL, FREET4, T3FREE, THYROIDAB in the last 72 hours. Anemia Panel: No results for input(s): VITAMINB12, FOLATE, FERRITIN, TIBC, IRON, RETICCTPCT in the last 72 hours. Sepsis Labs: No results for input(s): PROCALCITON, LATICACIDVEN in the last 168 hours.  Recent Results (from the past 240 hour(s))  Culture, Blood     Status: None (Preliminary result)   Collection Time: 01/04/17 11:57 AM  Result Value Ref Range Status   BLOOD CULTURE, ROUTINE Preliminary report  Preliminary   Organism ID, Bacteria Comment  Preliminary    Comment: No growth in 36 - 48 hours.  Culture,  Blood     Status: None (Preliminary result)   Collection Time: 01/04/17 11:57 AM  Result Value Ref Range Status   BLOOD CULTURE, ROUTINE Preliminary report  Preliminary   Organism ID, Bacteria Comment  Preliminary    Comment: No growth in 36 - 48 hours.  TECHNOLOGIST REVIEW     Status: None   Collection Time: 01/05/17 10:16 AM  Result Value Ref Range Status   Technologist Review Rare meta, few ovalos  Final  Stool culture (children & immunocomp patients)     Status: None (Preliminary result)   Collection Time: 01/05/17 11:15 AM  Result Value Ref Range Status   Salmonella/Shigella Screen Final report  Final   Campylobacter Culture PENDING  Incomplete   E coli, Shiga toxin Assay Negative Negative Final    Comment: (NOTE) Performed At: [facility] [place], [place] [id number] Rush Farmer MD TZ:[id number]   [facility]  Status: None   Collection Time: 01/05/17 11:15 AM  Result Value Ref Range Status   Stool Culture result 1 (RSASHR) Comment  Final    Comment: (NOTE) No Salmonella or Shigella recovered. Performed At: [facility] [place], [place] [id number] Rush Farmer MD EK:[id number]          Radiology Studies: Ct Head Wo Contrast  Result Date: 01/08/2017 CLINICAL DATA:  Episodic vertigo. Pancreatic cancer being treated with chemotherapy. Diabetes and hypertension. EXAM: CT HEAD WITHOUT CONTRAST TECHNIQUE: Contiguous axial images were obtained from the base of the skull through the vertex without intravenous contrast. COMPARISON:  None. FINDINGS: Brain: Generalized brain atrophy. Old small vessel cerebellar infarctions. Chronic small-vessel ischemic changes of the thalami, basal ganglia and hemispheric white matter. No cortical or large vessel territory infarction. No mass lesion, hemorrhage, hydrocephalus or extra-axial collection. Vascular: There is atherosclerotic calcification of the major vessels at the  base of the brain. Skull: Normal Sinuses/Orbits: Clear/normal Other: Benign choroid calcification and pineal calcification. IMPRESSION: No acute finding by CT. Generalized atrophy. Chronic small-vessel ischemic changes affecting the cerebellum an the cerebral hemispheres as outlined above. Electronically Signed   By: Nelson Chimes M.D.   On: 01/08/2017 10:53        Scheduled Meds: . atorvastatin  5 mg Oral q1800  . calcium-vitamin D  1 tablet Oral Daily  . [START ON 01/10/2017] ceFAZolin  1 g Other To OR  . enoxaparin (LOVENOX) injection  40 mg Subcutaneous Q24H  . feeding supplement (GLUCERNA SHAKE)  237 mL Oral TID BM  . insulin aspart  0-15 Units Subcutaneous TID WC  . insulin aspart  0-5 Units Subcutaneous QHS  . niacin  750 mg Oral BID  . pantoprazole  40 mg Oral Daily  . potassium chloride  40 mEq Oral Daily  . pyridOXINE  100 mg Oral Daily   Continuous Infusions: . 0.9 % NaCl with KCl 20 mEq / L 125 mL/hr at 01/09/17 0536     LOS: 3 days    Mirka Barbone Tanna Furry, MD [facility] Pager [phone]  If 7PM-7AM, please contact night-coverage [email or web address] Password [email or web address] 01/09/2017, 12:08 PM

## 2017-01-09 NOTE — H&P (View-Only) (Signed)
Assessment Active Problems:   Locally advanced pancreatic adenocarcinoma (HCC) with partial duodenal obstruction   Dehydration   Generalized weakness   Severe protein-calorie malnutrition (HCC)   Leukocytosis-WBC continues to increase   Plan:  Tentatively plan operative placement of feeding jejunostomy tomorrow or Tuesday.  Procedure and risks discussed with him.  Risks include but are not limited to bleeding, infection, wound problems, leak, need for revision, diarrhea, anesthesia.  He seems to understand and would like to proceed.  Am still concerned about increasing WBC.   LOS: 3 days        Chief Complaint/Subjective: Keeping some solid food down.  One episode of n/v.  Objective: Vital signs in last 24 hours: Temp:  [97.5 F (36.4 C)-98.1 F (36.7 C)] 97.7 F (36.5 C) (12/09 0500) Pulse Rate:  [57-61] 61 (12/09 0500) Resp:  [16] 16 (12/09 0500) BP: (95-176)/(54-75) 176/69 (12/09 0500) SpO2:  [98 %-100 %] 98 % (12/09 0500) Last BM Date: 01/09/17  Intake/Output from previous day: 12/08 0701 - 12/09 0700 In: 2427.1 [P.O.:600; I.V.:1827.1] Out: 1000 [Urine:1000] Intake/Output this shift: Total I/O In: -  Out: 150 [Urine:150]  PE: General- In NAD.  Awake and alert. Abdomen-soft, not tender  Lab Results:  Recent Labs    01/07/17 1040 01/09/17 0447  WBC 14.4* 16.1*  HGB 9.4* 10.7*  HCT 28.8* 32.8*  PLT 304 374   BMET Recent Labs    01/07/17 1040 01/09/17 0447  NA 139 138  K 3.4* 3.4*  CL 110 104  CO2 22 23  GLUCOSE 85 130*  BUN 13 7  CREATININE 0.68 0.58*  CALCIUM 8.2* 8.2*   PT/INR Recent Labs    01/07/17 1040  LABPROT 17.2*  INR 1.41   Comprehensive Metabolic Panel:    Component Value Date/Time   NA 138 01/09/2017 0447   NA 139 01/07/2017 1040   NA 140 01/05/2017 1016   NA 140 01/04/2017 1157   K 3.4 (L) 01/09/2017 0447   K 3.4 (L) 01/07/2017 1040   K 3.4 (L) 01/05/2017 1016   K 2.8 (LL) 01/04/2017 1157   CL 104 01/09/2017 0447   CL 110 01/07/2017 1040   CO2 23 01/09/2017 0447   CO2 22 01/07/2017 1040   CO2 20 (L) 01/05/2017 1016   CO2 26 01/04/2017 1157   BUN 7 01/09/2017 0447   BUN 13 01/07/2017 1040   BUN 28.0 (H) 01/05/2017 1016   BUN 29.4 (H) 01/04/2017 1157   CREATININE 0.58 (L) 01/09/2017 0447   CREATININE 0.68 01/07/2017 1040   CREATININE 0.9 01/05/2017 1016   CREATININE 1.1 01/04/2017 1157   GLUCOSE 130 (H) 01/09/2017 0447   GLUCOSE 85 01/07/2017 1040   GLUCOSE 94 01/05/2017 1016   GLUCOSE 82 01/04/2017 1157   CALCIUM 8.2 (L) 01/09/2017 0447   CALCIUM 8.2 (L) 01/07/2017 1040   CALCIUM 8.9 01/05/2017 1016   CALCIUM 9.2 01/04/2017 1157   AST 52 (H) 01/07/2017 1040   AST 70 (H) 01/06/2017 1739   AST 54 (H) 01/05/2017 1016   AST 21 01/04/2017 1157   ALT 40 01/07/2017 1040   ALT 43 01/06/2017 1739   ALT 28 01/05/2017 1016   ALT 15 01/04/2017 1157   ALKPHOS 73 01/07/2017 1040   ALKPHOS 67 01/06/2017 1739   ALKPHOS 86 01/05/2017 1016   ALKPHOS 74 01/04/2017 1157   BILITOT 0.6 01/07/2017 1040   BILITOT 0.8 01/06/2017 1739   BILITOT 0.74 01/05/2017 1016   BILITOT 1.15 01/04/2017 1157   PROT  5.3 (L) 01/07/2017 1040   PROT 5.3 (L) 01/06/2017 1739   PROT 5.7 (L) 01/05/2017 1016   PROT 6.0 (L) 01/04/2017 1157   ALBUMIN 2.3 (L) 01/07/2017 1040   ALBUMIN 2.3 (L) 01/06/2017 1739   ALBUMIN 2.4 (L) 01/05/2017 1016   ALBUMIN 2.6 (L) 01/04/2017 1157     Studies/Results: Ct Head Wo Contrast  Result Date: 01/08/2017 CLINICAL DATA:  Episodic vertigo. Pancreatic cancer being treated with chemotherapy. Diabetes and hypertension. EXAM: CT HEAD WITHOUT CONTRAST TECHNIQUE: Contiguous axial images were obtained from the base of the skull through the vertex without intravenous contrast. COMPARISON:  None. FINDINGS: Brain: Generalized brain atrophy. Old small vessel cerebellar infarctions. Chronic small-vessel ischemic changes of the thalami, basal ganglia and hemispheric white matter. No cortical or large vessel  territory infarction. No mass lesion, hemorrhage, hydrocephalus or extra-axial collection. Vascular: There is atherosclerotic calcification of the major vessels at the base of the brain. Skull: Normal Sinuses/Orbits: Clear/normal Other: Benign choroid calcification and pineal calcification. IMPRESSION: No acute finding by CT. Generalized atrophy. Chronic small-vessel ischemic changes affecting the cerebellum an the cerebral hemispheres as outlined above. Electronically Signed   By: Nelson Chimes M.D.   On: 01/08/2017 10:53    Anti-infectives: Anti-infectives (From admission, onward)   None       Jackolyn Confer 01/09/2017

## 2017-01-10 ENCOUNTER — Encounter (HOSPITAL_COMMUNITY)
Admission: EM | Disposition: A | Discharge status: Home Health | Payer: Self-pay | Source: Home / Self Care | Attending: Nephrology

## 2017-01-10 ENCOUNTER — Inpatient Hospital Stay (HOSPITAL_COMMUNITY): Payer: Medicare Other | Admitting: Certified Registered Nurse Anesthetist

## 2017-01-10 ENCOUNTER — Encounter (HOSPITAL_COMMUNITY): Payer: Self-pay | Admitting: Certified Registered Nurse Anesthetist

## 2017-01-10 HISTORY — PX: GASTROJEJUNOSTOMY: SHX1697

## 2017-01-10 LAB — BASIC METABOLIC PANEL
ANION GAP: 8 (ref 5–15)
BUN: 8 mg/dL (ref 6–20)
CALCIUM: 8 mg/dL — AB (ref 8.9–10.3)
CO2: 24 mmol/L (ref 22–32)
CREATININE: 0.61 mg/dL (ref 0.61–1.24)
Chloride: 107 mmol/L (ref 101–111)
Glucose, Bld: 179 mg/dL — ABNORMAL HIGH (ref 65–99)
Potassium: 3.6 mmol/L (ref 3.5–5.1)
Sodium: 139 mmol/L (ref 135–145)

## 2017-01-10 LAB — CBC
HCT: 31.3 % — ABNORMAL LOW (ref 39.0–52.0)
Hemoglobin: 10.3 g/dL — ABNORMAL LOW (ref 13.0–17.0)
MCH: 29.6 pg (ref 26.0–34.0)
MCHC: 32.9 g/dL (ref 30.0–36.0)
MCV: 89.9 fL (ref 78.0–100.0)
PLATELETS: 366 10*3/uL (ref 150–400)
RBC: 3.48 MIL/uL — AB (ref 4.22–5.81)
RDW: 15.2 % (ref 11.5–15.5)
WBC: 17.6 10*3/uL — AB (ref 4.0–10.5)

## 2017-01-10 LAB — CULTURE, BLOOD (SINGLE)

## 2017-01-10 LAB — MAGNESIUM: Magnesium: 1.7 mg/dL (ref 1.7–2.4)

## 2017-01-10 LAB — GLUCOSE, CAPILLARY
GLUCOSE-CAPILLARY: 161 mg/dL — AB (ref 65–99)
GLUCOSE-CAPILLARY: 162 mg/dL — AB (ref 65–99)
GLUCOSE-CAPILLARY: 159 mg/dL — AB (ref 65–99)
GLUCOSE-CAPILLARY: 135 mg/dL — AB (ref 65–99)
Glucose-Capillary: 178 mg/dL — ABNORMAL HIGH (ref 65–99)

## 2017-01-10 LAB — TSH: TSH: 1.31 u[IU]/mL (ref 0.350–4.500)

## 2017-01-10 SURGERY — GASTROJEJUNOSTOMY
Anesthesia: General | Site: Abdomen

## 2017-01-10 MED ORDER — FENTANYL CITRATE (PF) 100 MCG/2ML IJ SOLN
INTRAMUSCULAR | Status: AC
  Filled 2017-01-10: qty 2

## 2017-01-10 MED ORDER — PROPOFOL 10 MG/ML IV BOLUS
INTRAVENOUS | Status: DC | PRN
  Administered 2017-01-10: 120 mg via INTRAVENOUS

## 2017-01-10 MED ORDER — LACTATED RINGERS IV SOLN
INTRAVENOUS | Status: DC
  Administered 2017-01-10 (×3): via INTRAVENOUS

## 2017-01-10 MED ORDER — BUPIVACAINE-EPINEPHRINE (PF) 0.25% -1:200000 IJ SOLN
INTRAMUSCULAR | Status: AC
  Filled 2017-01-10: qty 30

## 2017-01-10 MED ORDER — LIDOCAINE 2% (20 MG/ML) 5 ML SYRINGE
INTRAMUSCULAR | Status: DC | PRN
Start: 2017-01-10 — End: 2017-01-10
  Administered 2017-01-10: 60 mg via INTRAVENOUS

## 2017-01-10 MED ORDER — ONDANSETRON HCL 4 MG/2ML IJ SOLN
INTRAMUSCULAR | Status: AC
  Filled 2017-01-10: qty 2

## 2017-01-10 MED ORDER — SUCCINYLCHOLINE CHLORIDE 200 MG/10ML IV SOSY
PREFILLED_SYRINGE | INTRAVENOUS | Status: DC | PRN
  Administered 2017-01-10: 120 mg via INTRAVENOUS

## 2017-01-10 MED ORDER — 0.9 % SODIUM CHLORIDE (POUR BTL) OPTIME
TOPICAL | Status: DC | PRN
  Administered 2017-01-10: 1000 mL

## 2017-01-10 MED ORDER — PROPOFOL 10 MG/ML IV BOLUS
INTRAVENOUS | Status: AC
  Filled 2017-01-10: qty 20

## 2017-01-10 MED ORDER — FENTANYL CITRATE (PF) 100 MCG/2ML IJ SOLN
INTRAMUSCULAR | Status: DC | PRN
  Administered 2017-01-10: 75 ug via INTRAVENOUS
  Administered 2017-01-10: 50 ug via INTRAVENOUS
  Administered 2017-01-10: 25 ug via INTRAVENOUS
  Administered 2017-01-10: 50 ug via INTRAVENOUS

## 2017-01-10 MED ORDER — ONDANSETRON HCL 4 MG/2ML IJ SOLN
INTRAMUSCULAR | Status: DC | PRN
  Administered 2017-01-10: 4 mg via INTRAVENOUS

## 2017-01-10 MED ORDER — PROMETHAZINE HCL 25 MG/ML IJ SOLN: 6.2500 mg | INTRAMUSCULAR | Status: DC | PRN

## 2017-01-10 MED ORDER — BUPIVACAINE-EPINEPHRINE 0.25% -1:200000 IJ SOLN
INTRAMUSCULAR | Status: DC | PRN
  Administered 2017-01-10: 20 mL

## 2017-01-10 MED ORDER — OXYCODONE HCL 5 MG/5ML PO SOLN
5.0000 mg | Freq: Once | ORAL | Status: DC | PRN
  Filled 2017-01-10: qty 5

## 2017-01-10 MED ORDER — CEFAZOLIN SODIUM-DEXTROSE 2-4 GM/100ML-% IV SOLN
2.0000 g | Freq: Three times a day (TID) | INTRAVENOUS | Status: AC
  Administered 2017-01-10: 2 g via INTRAVENOUS
  Filled 2017-01-10: qty 100

## 2017-01-10 MED ORDER — ONDANSETRON HCL 4 MG/2ML IJ SOLN
4.0000 mg | Freq: Four times a day (QID) | INTRAMUSCULAR | Status: DC | PRN
  Administered 2017-01-14: 4 mg via INTRAVENOUS
  Filled 2017-01-10: qty 2

## 2017-01-10 MED ORDER — ESMOLOL HCL 100 MG/10ML IV SOLN
INTRAVENOUS | Status: DC | PRN
  Administered 2017-01-10: 10 mg via INTRAVENOUS

## 2017-01-10 MED ORDER — OXYCODONE HCL 5 MG PO TABS: 5.0000 mg | ORAL_TABLET | Freq: Once | ORAL | Status: DC | PRN

## 2017-01-10 MED ORDER — PROMETHAZINE HCL 25 MG/ML IJ SOLN
12.5000 mg | Freq: Once | INTRAMUSCULAR | Status: AC
  Administered 2017-01-10: 12.5 mg via INTRAVENOUS
  Filled 2017-01-10: qty 1

## 2017-01-10 MED ORDER — LIP MEDEX EX OINT
TOPICAL_OINTMENT | CUTANEOUS | Status: AC
  Administered 2017-01-10: 18:00:00
  Filled 2017-01-10: qty 7

## 2017-01-10 MED ORDER — HEPARIN SODIUM (PORCINE) 5000 UNIT/ML IJ SOLN
5000.0000 [IU] | Freq: Three times a day (TID) | INTRAMUSCULAR | Status: DC
  Administered 2017-01-10 – 2017-01-11 (×3): 5000 [IU] via SUBCUTANEOUS
  Filled 2017-01-10 (×3): qty 1

## 2017-01-10 MED ORDER — HYDROMORPHONE HCL 1 MG/ML IJ SOLN: 0.2500 mg | INTRAMUSCULAR | Status: DC | PRN

## 2017-01-10 MED ORDER — CEFAZOLIN SODIUM-DEXTROSE 2-3 GM-%(50ML) IV SOLR
INTRAVENOUS | Status: DC | PRN
  Administered 2017-01-10: 2 g via INTRAVENOUS

## 2017-01-10 MED ORDER — ONDANSETRON 4 MG PO TBDP: 4.0000 mg | ORAL_TABLET | Freq: Four times a day (QID) | ORAL | Status: DC | PRN

## 2017-01-10 MED ORDER — KCL IN DEXTROSE-NACL 20-5-0.45 MEQ/L-%-% IV SOLN
INTRAVENOUS | Status: DC
  Administered 2017-01-10 – 2017-01-13 (×4): via INTRAVENOUS
  Filled 2017-01-10 (×7): qty 1000

## 2017-01-10 MED ORDER — ROCURONIUM BROMIDE 50 MG/5ML IV SOSY
PREFILLED_SYRINGE | INTRAVENOUS | Status: AC
  Filled 2017-01-10: qty 5

## 2017-01-10 MED ORDER — ROCURONIUM BROMIDE 10 MG/ML (PF) SYRINGE
PREFILLED_SYRINGE | INTRAVENOUS | Status: DC | PRN
Start: 2017-01-10 — End: 2017-01-10
  Administered 2017-01-10: 10 mg via INTRAVENOUS
  Administered 2017-01-10: 40 mg via INTRAVENOUS

## 2017-01-10 MED ORDER — SUCCINYLCHOLINE CHLORIDE 200 MG/10ML IV SOSY
PREFILLED_SYRINGE | INTRAVENOUS | Status: AC
  Filled 2017-01-10: qty 10

## 2017-01-10 MED ORDER — SUGAMMADEX SODIUM 200 MG/2ML IV SOLN
INTRAVENOUS | Status: DC | PRN
  Administered 2017-01-10: 200 mg via INTRAVENOUS

## 2017-01-10 MED ORDER — CEFAZOLIN SODIUM-DEXTROSE 2-4 GM/100ML-% IV SOLN
INTRAVENOUS | Status: AC
  Filled 2017-01-10: qty 100

## 2017-01-10 SURGICAL SUPPLY — 27 items
CATH ROBINSON RED A/P 14FR (CATHETERS) ×2 IMPLANT
COVER SURGICAL LIGHT HANDLE (MISCELLANEOUS) ×2 IMPLANT
DERMABOND ADVANCED (GAUZE/BANDAGES/DRESSINGS) ×1
DERMABOND ADVANCED .7 DNX12 (GAUZE/BANDAGES/DRESSINGS) ×1 IMPLANT
DRAPE LAPAROSCOPIC ABDOMINAL (DRAPES) ×2 IMPLANT
DRSG TEGADERM 4X4.75 (GAUZE/BANDAGES/DRESSINGS) ×4 IMPLANT
GLOVE BIOGEL M 8.0 STRL (GLOVE) ×2 IMPLANT
GLOVE BIOGEL PI IND STRL 7.0 (GLOVE) ×2 IMPLANT
GLOVE BIOGEL PI INDICATOR 7.0 (GLOVE) ×2
GOWN SPEC L4 XLG W/TWL (GOWN DISPOSABLE) ×2 IMPLANT
GOWN STRL REUS W/TWL LRG LVL3 (GOWN DISPOSABLE) ×2 IMPLANT
GOWN STRL REUS W/TWL XL LVL3 (GOWN DISPOSABLE) ×6 IMPLANT
KIT BASIN OR (CUSTOM PROCEDURE TRAY) ×2 IMPLANT
NEEDLE HYPO 22GX1.5 SAFETY (NEEDLE) ×2 IMPLANT
PLUG CATH AND CAP STER (CATHETERS) ×2 IMPLANT
SET IRRIG TUBING LAPAROSCOPIC (IRRIGATION / IRRIGATOR) ×2 IMPLANT
SLEEVE ADV FIXATION 5X100MM (TROCAR) ×4 IMPLANT
SPONGE DRAIN TRACH 4X4 STRL 2S (GAUZE/BANDAGES/DRESSINGS) ×2 IMPLANT
SUT ETHILON 3 0 PS 1 (SUTURE) ×2 IMPLANT
SUT MNCRL AB 4-0 PS2 18 (SUTURE) ×2 IMPLANT
SUT NOVA 1 T20/GS 25DT (SUTURE) ×4 IMPLANT
SUT PDS AB 3-0 SH 27 (SUTURE) ×2 IMPLANT
SUT SILK 3 0 SH CR/8 (SUTURE) ×2 IMPLANT
SYR 20CC LL (SYRINGE) ×2 IMPLANT
SYR CONTROL 10ML LL (SYRINGE) ×2 IMPLANT
TOWEL OR 17X26 10 PK STRL BLUE (TOWEL DISPOSABLE) ×2 IMPLANT
TROCAR BLADELESS OPT 5 100 (ENDOMECHANICALS) ×2 IMPLANT

## 2017-01-10 NOTE — Progress Notes (Signed)
PROGRESS NOTE    Cody Mckay  KXF:[id number] DOB: 31-Jan-1933 DOA: 01/06/2017 PCP: Patient, No Pcp Per   Brief Narrative: 83 y.o. 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  Assessment & Plan:   #Severe protein calorie malnutrition, dehydration due to poor intake in the setting of pancreatic malignancy: -Evaluated by oncologist and general surgery.  Status post jejunostomy feeding tube placed by surgery today.  Plan to start feeding via tube when okay from surgery.  For now continue IV fluid and supportive care.  #Pancreatic cancer, adenocarcinoma in head of pancreas: Unresectable.  There is partial duodenal obstruction secondary due to tumor.  As per oncology, it was reviewed with GI Dr. Ardis Hughs, who does not think this is feasible for endoscopic stenting. -Continue supportive care, symptom management.  #Dizziness likely contributed by orthostatic hypotension and bradycardia: Received IV fluid bolus and hydration with improvement in symptoms.  CT scan of head with no acute finding.  Echo unremarkable.  Continue supportive care.  Continue telemetry monitoring.  I have reviewed with the patient and he verbalized understanding. TSH level acceptable.  #Type 2 diabetes: Continue sliding scale.  Monitor blood sugar level.  #Hypertension: Continue to monitor blood pressure.  Blood pressure level acceptable.  #Hyperlipidemia continue current medication.  #GERD: Continue PPI  #Persistent leukocytosis likely due to dehydration, malignancy: Patient has no UTI, no pneumonia.  -Patient has no sign of infection.  Continue IV fluid.  Monitor CBC in the morning.  Discontinue niacin, Lipitor as per patient's request.  DVT prophylaxis: Lovenox hold because of procedure today Code Status: DNR Family  Communication: No family at bedside Disposition Plan: Currently admitted    Consultants:   Oncology  General surgery  Procedures: None Antimicrobials: None  Subjective: Seen and examined at bedside.  Denied nausea vomiting chest pain shortness of breath.  Came from surgery and tolerated well. Objective: Vitals:   01/10/17 1245 01/10/17 1300 01/10/17 1315 01/10/17 1335  BP: (!) 156/70 (!) 153/77 (!) 169/74   Pulse: 67 72 67   Resp: 12 18 18    Temp:   97.9 F (36.6 C) 97.7 F (36.5 C)  TempSrc:      SpO2: 99% 99% 95%   Weight:      Height:        Intake/Output Summary (Last 24 hours) at 01/10/2017 1449 Last data filed at 01/10/2017 1346 Gross per 24 hour  Intake 3409.58 ml  Output 3175 ml  Net 234.58 ml   Filed Weights   01/06/17 2125  Weight: 72 kg (158 lb 11.7 oz)    Examination:  General exam: Thin cachectic male lying in bed comfortable Respiratory system: Clear bilateral, no wheezing  Cardiovascular system: regular rate rhythm S1-S2 normal. Gastrointestinal system: Abdomen soft, nontender.  No sign of bleeding. Central nervous system: Alert awake and following commands. Skin: No rashes, lesions or ulcers Psychiatry: Judgement and insight appear normal. Mood & affect appropriate.     Data Reviewed: I have personally reviewed following labs and imaging studies  CBC: Recent Labs  Lab 01/05/17 1016 01/06/17 1739 01/06/17 1746 01/07/17 1040 01/09/17 0447 01/10/17 0356  WBC 13.5* 14.9*  --  14.4* 16.1* 17.6*  NEUTROABS 10.4* 11.3*  --   --   --   --   HGB 10.3* 9.8* 9.2* 9.4* 10.7* 10.3*  HCT 31.2* 29.0* 27.0* 28.8* 32.8* 31.3*  MCV 91.0 89.5  --  90.0 90.4 89.9  PLT 257 274  --  304 374 673   Basic Metabolic Panel: Recent Labs  Lab 01/05/17 1016 01/06/17 1739 01/06/17 1746 01/07/17 1040 01/09/17 0447 01/10/17 0356  NA 140 136 139 139 138 139  K 3.4* 3.4* 3.5 3.4* 3.4* 3.6  CL  --  108 105 110 104 107  CO2 20* 22  --  22 23 24     GLUCOSE 94 60* 58* 85 130* 179*  BUN 28.0* 19 16 13 7 8   CREATININE 0.9 0.71 0.70 0.68 0.58* 0.61  CALCIUM 8.9 8.2*  --  8.2* 8.2* 8.0*  MG 2.1 1.8  --   --  1.4* 1.7   GFR: Estimated Creatinine Clearance: 72.5 mL/min (by C-G formula based on SCr of 0.61 mg/dL). Liver Function Tests: Recent Labs  Lab 01/04/17 1157 01/05/17 1016 01/06/17 1739 01/07/17 1040  AST 21 54* 70* 52*  ALT 15 28 43 40  ALKPHOS 74 86 67 73  BILITOT 1.15 0.74 0.8 0.6  PROT 6.0* 5.7* 5.3* 5.3*  ALBUMIN 2.6* 2.4* 2.3* 2.3*   Recent Labs  Lab 01/06/17 1739  LIPASE 35   No results for input(s): AMMONIA in the last 168 hours. Coagulation Profile: Recent Labs  Lab 01/07/17 1040  INR 1.41   Cardiac Enzymes: Recent Labs  Lab 01/07/17 2359  TROPONINI <0.03   BNP (last 3 results) No results for input(s): PROBNP in the last 8760 hours. HbA1C: No results for input(s): HGBA1C in the last 72 hours. CBG: Recent Labs  Lab 01/09/17 1739 01/09/17 2134 01/10/17 0836 01/10/17 1305 01/10/17 1338  GLUCAP 141* 127* 178* 161* 162*   Lipid Profile: No results for input(s): CHOL, HDL, LDLCALC, TRIG, CHOLHDL, LDLDIRECT in the last 72 hours. Thyroid Function Tests: Recent Labs    01/10/17 0356  TSH 1.310   Anemia Panel: No results for input(s): VITAMINB12, FOLATE, FERRITIN, TIBC, IRON, RETICCTPCT in the last 72 hours. Sepsis Labs: No results for input(s): PROCALCITON, LATICACIDVEN in the last 168 hours.  Recent Results (from the past 240 hour(s))  Culture, Blood     Status: None   Collection Time: 01/04/17 11:57 AM  Result Value Ref Range Status   BLOOD CULTURE, ROUTINE Final report  Final   Organism ID, Bacteria Comment  Final    Comment: No aerobic or anaerobic growth in five days.  Culture, Blood     Status: None   Collection Time: 01/04/17 11:57 AM  Result Value Ref Range Status   BLOOD CULTURE, ROUTINE Final report  Final   Organism ID, Bacteria Comment  Final    Comment: No aerobic or  anaerobic growth in five days.  TECHNOLOGIST REVIEW     Status: None   Collection Time: 01/05/17 10:16 AM  Result Value Ref Range Status   Technologist Review Rare meta, few ovalos  Final  Stool culture (children & immunocomp patients)     Status: None (Preliminary result)   Collection Time: 01/05/17 11:15 AM  Result Value Ref Range Status   Salmonella/Shigella Screen Final report  Final   Campylobacter Culture PENDING  Incomplete   E coli, Shiga toxin Assay Negative Negative Final    Comment: (NOTE) Performed At: [facility] [place], [place] [id number] Rush Farmer MD OX:[id number]   STOOL CULTURE REFLEX - RSASHR     Status: None   Collection Time: 01/05/17 11:15 AM  Result Value Ref Range Status   Stool Culture result 1 (  RSASHR) Comment  Final    Comment: (NOTE) No Salmonella or Shigella recovered. Performed At: [facility] [place], [place] [id number] Rush Farmer MD HD:[id number]   Surgical pcr screen     Status: None   Collection Time: 01/09/17 12:26 PM  Result Value Ref Range Status   MRSA, PCR NEGATIVE NEGATIVE Final   Staphylococcus aureus NEGATIVE NEGATIVE Final    Comment: (NOTE) The Xpert SA Assay (FDA approved for NASAL specimens in patients 63 years of age and older), is one component of a comprehensive surveillance program. It is not intended to diagnose infection nor to guide or monitor treatment.          Radiology Studies: No results found.      Scheduled Meds: . calcium-vitamin D  1 tablet Oral Daily  . feeding supplement (GLUCERNA SHAKE)  237 mL Oral TID BM  . insulin aspart  0-15 Units Subcutaneous TID WC  . insulin aspart  0-5 Units Subcutaneous QHS  . pantoprazole  40 mg Oral Daily  . potassium chloride  40 mEq Oral Daily  . pyridOXINE  100 mg Oral Daily   Continuous Infusions: . 0.9 % NaCl with KCl 20 mEq / L 125 mL/hr at 01/10/17 0643  . ceFAZolin    . lactated ringers 10 mL/hr at  01/10/17 1014     LOS: 4 days    Carmella Kees Tanna Furry, MD [facility] Pager [phone]  If 7PM-7AM, please contact night-coverage [email or web address] Password [email or web address] 01/10/2017, 2:49 PM

## 2017-01-10 NOTE — Op Note (Signed)
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  After anesthesia was administered and the patient was prepped a timeout was performed.  Access achieved through the right upper quadrant with 5 mm Optiview.  Oblique 5 mm placed on the left at level of the umbilicus and 5 mm placed in the midline above the umbilicus.  The ligament of Treitz was identified and 20 cm distally I oriented the jejunum with clamps and then opened and placed Babcock clamps on the small bowel.  The 14 red robinson was passed through the oblique left port and then the tube was modified with 2 additional holes and then it was passed through a purse string of 4-0 pds.  The tube was imbricated with Witzel technique with 3 sutures.  It was then tacked anteriorly with 2 sutures of 3-0 silk.  The tube was checked in the bowel and was in good position and was easily flushed.  The midline was closed with 0 Novafil interrupted sutures.  4-0 monocryl was used on the skin along with Dermabond.    The patient tolerated the procedure well and was taken to the PACU in stable condition.     Matt B. Hassell Done, [place], [facility], [place]

## 2017-01-10 NOTE — Anesthesia Postprocedure Evaluation (Signed)
Anesthesia Post Note  Patient: Cody Mckay  Procedure(s) Performed: LAPARSCOPIC ASSITED GASTROJEJUNOSTOMY (N/A Abdomen)     Patient location during evaluation: PACU Anesthesia Type: General Level of consciousness: awake and alert Pain management: pain level controlled Vital Signs Assessment: post-procedure vital signs reviewed and stable Respiratory status: spontaneous breathing, nonlabored ventilation and respiratory function stable Cardiovascular status: blood pressure returned to baseline and stable Postop Assessment: no apparent nausea or vomiting Anesthetic complications: no    Last Vitals:  Vitals:   01/10/17 1315 01/10/17 1335  BP: (!) 169/74   Pulse: 67   Resp: 18   Temp: 36.6 C 36.5 C  SpO2: 95%     Last Pain:  Vitals:   01/10/17 1315  TempSrc:   PainSc: 0-No pain                 Lynda Rainwater

## 2017-01-10 NOTE — Interval H&P Note (Signed)
History and Physical Interval Note:  01/10/2017 10:52 AM  Cody Mckay  has presented today for surgery, with the diagnosis of NEED FOR FEEDING TUBE  The various methods of treatment have been discussed with the patient and family. After consideration of risks, benefits and other options for treatment, the patient has consented to  Procedure(s): GASTROJEJUNOSTOMY - OPEN (N/A) as a surgical intervention .  The patient's history has been reviewed, patient examined, no change in status, stable for surgery.  I have reviewed the patient's chart and labs.  Questions were answered to the patient's satisfaction.     Pedro Earls

## 2017-01-10 NOTE — Transfer of Care (Signed)
Immediate Anesthesia Transfer of Care Note  Patient: Cody Mckay  Procedure(s) Performed: LAPARSCOPIC ASSITED GASTROJEJUNOSTOMY (N/A Abdomen)  Patient Location: PACU  Anesthesia Type:General  Level of Consciousness: awake, alert  and oriented  Airway & Oxygen Therapy: Patient Spontanous Breathing and Patient connected to face mask oxygen  Post-op Assessment: Report given to RN and Post -op Vital signs reviewed and stable  Post vital signs: Reviewed and stable  Last Vitals:  Vitals:   01/09/17 2122 01/10/17 0635  BP: (!) 148/81 (!) 145/67  Pulse: 72 72  Resp: 18 17  Temp: 36.9 C 36.9 C  SpO2: 100% 99%    Last Pain:  Vitals:   01/10/17 0635  TempSrc: Oral  PainSc:          Complications: No apparent anesthesia complications

## 2017-01-10 NOTE — Anesthesia Procedure Notes (Signed)
Procedure Name: Intubation Date/Time: 01/10/2017 11:02 AM Performed by: [place] Demarious Kapur C, CRNA Pre-anesthesia Checklist: Patient identified, Emergency Drugs available, Suction available and Patient being monitored Patient Re-evaluated:Patient Re-evaluated prior to induction Oxygen Delivery Method: Circle system utilized Preoxygenation: Pre-oxygenation with 100% oxygen Induction Type: IV induction, Rapid sequence and Cricoid Pressure applied Laryngoscope Size: Mac and 4 Grade View: Grade II Tube type: Oral Tube size: 7.5 mm Number of attempts: 1 Airway Equipment and Method: Stylet and Oral airway Placement Confirmation: ETT inserted through vocal cords under direct vision,  positive ETCO2 and breath sounds checked- equal and bilateral Secured at: 23 cm Tube secured with: Tape Dental Injury: Teeth and Oropharynx as per pre-operative assessment

## 2017-01-10 NOTE — Anesthesia Preprocedure Evaluation (Signed)
Anesthesia Evaluation  Patient identified by MRN, date of birth, ID band Patient awake    Reviewed: Allergy & Precautions, NPO status , Patient's Chart, lab work & pertinent test results  Airway Mallampati: II  TM Distance: >3 FB Neck ROM: Full    Dental no notable dental hx.    Pulmonary neg pulmonary ROS,    Pulmonary exam normal breath sounds clear to auscultation       Cardiovascular hypertension, Pt. on medications negative cardio ROS Normal cardiovascular exam Rhythm:Regular Rate:Normal     Neuro/Psych negative neurological ROS  negative psych ROS   GI/Hepatic negative GI ROS, Neg liver ROS,   Endo/Other  negative endocrine ROSdiabetes, Type 2, Insulin Dependent  Renal/GU negative Renal ROS  negative genitourinary   Musculoskeletal negative musculoskeletal ROS (+)   Abdominal   Peds negative pediatric ROS (+)  Hematology negative hematology ROS (+)   Anesthesia Other Findings Unresectable pancreatic cancer Chronic malnutrition  Reproductive/Obstetrics negative OB ROS                             Anesthesia Physical Anesthesia Plan  ASA: IV  Anesthesia Plan: General   Post-op Pain Management:    Induction: Intravenous  PONV Risk Score and Plan: 2 and Ondansetron and Midazolam  Airway Management Planned: Oral ETT  Additional Equipment:   Intra-op Plan:   Post-operative Plan: Extubation in OR  Informed Consent: I have reviewed the patients History and Physical, chart, labs and discussed the procedure including the risks, benefits and alternatives for the proposed anesthesia with the patient or authorized representative who has indicated his/her understanding and acceptance.   Dental advisory given  Plan Discussed with: CRNA  Anesthesia Plan Comments:         Anesthesia Quick Evaluation

## 2017-01-11 ENCOUNTER — Inpatient Hospital Stay: Payer: Self-pay

## 2017-01-11 DIAGNOSIS — Z931 Gastrostomy status: Secondary | ICD-10-CM

## 2017-01-11 LAB — BASIC METABOLIC PANEL
Anion gap: 5 (ref 5–15)
BUN: 7 mg/dL (ref 6–20)
CHLORIDE: 105 mmol/L (ref 101–111)
CO2: 28 mmol/L (ref 22–32)
CREATININE: 0.61 mg/dL (ref 0.61–1.24)
Calcium: 7.9 mg/dL — ABNORMAL LOW (ref 8.9–10.3)
GFR calc non Af Amer: >60 mL/min (ref >60)
GLUCOSE: 186 mg/dL — AB (ref 65–99)
Potassium: 3.3 mmol/L — ABNORMAL LOW (ref 3.5–5.1)
Sodium: 138 mmol/L (ref 135–145)

## 2017-01-11 LAB — GLUCOSE, CAPILLARY
GLUCOSE-CAPILLARY: 131 mg/dL — AB (ref 65–99)
GLUCOSE-CAPILLARY: 136 mg/dL — AB (ref 65–99)
GLUCOSE-CAPILLARY: 153 mg/dL — AB (ref 65–99)
GLUCOSE-CAPILLARY: 155 mg/dL — AB (ref 65–99)
Glucose-Capillary: 181 mg/dL — ABNORMAL HIGH (ref 65–99)

## 2017-01-11 LAB — CBC
HEMATOCRIT: 29.8 % — AB (ref 39.0–52.0)
HEMOGLOBIN: 9.7 g/dL — AB (ref 13.0–17.0)
MCH: 29.6 pg (ref 26.0–34.0)
MCHC: 32.6 g/dL (ref 30.0–36.0)
MCV: 90.9 fL (ref 78.0–100.0)
Platelets: 327 10*3/uL (ref 150–400)
RBC: 3.28 MIL/uL — ABNORMAL LOW (ref 4.22–5.81)
RDW: 15.2 % (ref 11.5–15.5)
WBC: 18.4 10*3/uL — ABNORMAL HIGH (ref 4.0–10.5)

## 2017-01-11 LAB — STOOL CULTURE: E COLI SHIGA TOXIN ASSAY: NEGATIVE

## 2017-01-11 LAB — STOOL CULTURE REFLEX - CMPCXR

## 2017-01-11 LAB — STOOL CULTURE REFLEX - RSASHR

## 2017-01-11 MED ORDER — JEVITY 1.2 CAL PO LIQD
1000.0000 mL | ORAL | Status: DC
  Filled 2017-01-11: qty 1000

## 2017-01-11 MED ORDER — HYDROCODONE-ACETAMINOPHEN 5-325 MG PO TABS: 1.0000 | ORAL_TABLET | ORAL | Status: DC | PRN

## 2017-01-11 MED ORDER — INSULIN ASPART 100 UNIT/ML ~~LOC~~ SOLN
0.0000 [IU] | SUBCUTANEOUS | Status: DC
  Administered 2017-01-11 (×2): 2 [IU] via SUBCUTANEOUS
  Administered 2017-01-12 (×6): 3 [IU] via SUBCUTANEOUS
  Administered 2017-01-13: 8 [IU] via SUBCUTANEOUS
  Administered 2017-01-13 (×2): 5 [IU] via SUBCUTANEOUS
  Administered 2017-01-13: 3 [IU] via SUBCUTANEOUS
  Administered 2017-01-13: 5 [IU] via SUBCUTANEOUS
  Administered 2017-01-13 – 2017-01-14 (×3): 3 [IU] via SUBCUTANEOUS
  Administered 2017-01-14: 5 [IU] via SUBCUTANEOUS
  Administered 2017-01-14 (×2): 8 [IU] via SUBCUTANEOUS
  Administered 2017-01-15: 5 [IU] via SUBCUTANEOUS
  Administered 2017-01-15: 8 [IU] via SUBCUTANEOUS
  Administered 2017-01-15 (×2): 5 [IU] via SUBCUTANEOUS
  Administered 2017-01-15: 3 [IU] via SUBCUTANEOUS
  Administered 2017-01-15: 5 [IU] via SUBCUTANEOUS
  Administered 2017-01-16 (×2): 2 [IU] via SUBCUTANEOUS
  Administered 2017-01-16 (×2): 3 [IU] via SUBCUTANEOUS
  Administered 2017-01-16: 8 [IU] via SUBCUTANEOUS
  Administered 2017-01-17: 5 [IU] via SUBCUTANEOUS
  Administered 2017-01-17: 8 [IU] via SUBCUTANEOUS
  Administered 2017-01-17: 5 [IU] via SUBCUTANEOUS

## 2017-01-11 MED ORDER — POTASSIUM CHLORIDE 10 MEQ/100ML IV SOLN
10.0000 meq | INTRAVENOUS | Status: AC
Start: 2017-01-11 — End: 2017-01-11
  Administered 2017-01-11 (×3): 10 meq via INTRAVENOUS
  Filled 2017-01-11 (×3): qty 100

## 2017-01-11 MED ORDER — ACETAMINOPHEN 160 MG/5ML PO SOLN
650.0000 mg | Freq: Four times a day (QID) | ORAL | Status: DC | PRN
  Administered 2017-01-14: 650 mg
  Filled 2017-01-11: qty 20.3

## 2017-01-11 MED ORDER — PANTOPRAZOLE SODIUM 40 MG IV SOLR
40.0000 mg | INTRAVENOUS | Status: DC
  Administered 2017-01-11 – 2017-01-15 (×5): 40 mg via INTRAVENOUS
  Filled 2017-01-11 (×5): qty 40

## 2017-01-11 MED ORDER — INSULIN ASPART 100 UNIT/ML ~~LOC~~ SOLN: 0.0000 [IU] | SUBCUTANEOUS | Status: DC

## 2017-01-11 MED ORDER — HYDROCODONE-ACETAMINOPHEN 7.5-325 MG/15ML PO SOLN: 10.0000 mL | ORAL | Status: DC | PRN

## 2017-01-11 MED ORDER — PRO-STAT SUGAR FREE PO LIQD
30.0000 mL | Freq: Two times a day (BID) | ORAL | Status: DC
  Administered 2017-01-12: 30 mL via ORAL
  Filled 2017-01-11 (×2): qty 30

## 2017-01-11 MED ORDER — OSMOLITE 1.5 CAL PO LIQD
1000.0000 mL | ORAL | Status: DC
  Administered 2017-01-11 – 2017-01-16 (×7): 1000 mL
  Filled 2017-01-11 (×12): qty 1000

## 2017-01-11 MED ORDER — ENOXAPARIN SODIUM 40 MG/0.4ML ~~LOC~~ SOLN
40.0000 mg | SUBCUTANEOUS | Status: DC
  Administered 2017-01-11 – 2017-01-16 (×6): 40 mg via SUBCUTANEOUS
  Filled 2017-01-11 (×6): qty 0.4

## 2017-01-11 MED ORDER — TRAZODONE HCL 50 MG PO TABS: 25.0000 mg | ORAL_TABLET | Freq: Every evening | ORAL | Status: DC | PRN

## 2017-01-11 MED ORDER — NONFORMULARY OR COMPOUNDED ITEM
25.0000 mg | Freq: Every evening | Status: DC | PRN
  Administered 2017-01-14: 25 mg
  Filled 2017-01-11 (×3): qty 1

## 2017-01-11 NOTE — Progress Notes (Addendum)
PROGRESS NOTE    Cody Mckay  NAT:[id number] DOB: May 08, 1932 DOA: 01/06/2017 PCP: Dorothyann Peng, NP   Brief Narrative: 83 y.o. 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  Assessment & Plan:   #Severe protein calorie malnutrition, dehydration due to poor intake in the setting of pancreatic malignancy: -Evaluated by oncologist and general surgery.  Status post jejunostomy feeding tube placement by general surgery on 12/10.  Starting low feeding rate today.  Dietary following.  Patient is to be strict n.p.o. per general surgery.  Continue to monitor for tube feeding. -PT/OT ordered  #Pancreatic cancer, adenocarcinoma in head of pancreas: Unresectable.  There is partial duodenal obstruction secondary due to tumor.  As per oncology, it was reviewed with GI Dr. Ardis Hughs, who does not think this is feasible for endoscopic stenting. -Continue supportive care, symptom management.  #Dizziness likely contributed by orthostatic hypotension and bradycardia: Received IV fluid bolus and hydration with improvement in symptoms.  CT scan of head with no acute finding.  Echo unremarkable.  Continue supportive care.  Continue telemetry monitoring.  I have reviewed with the patient and he verbalized understanding. TSH level acceptable. -Symptoms improved.  Blood pressure improved.  #Type 2 diabetes: Continue sliding scale.  Monitor blood sugar level.  #Hypertension: Continue to monitor blood pressure.  Blood pressure level acceptable.  #Hyperlipidemia continue current medication.  #GERD: Continue PPI  # Hypokalemia: Repleted with potassium chloride.  Repeat lab in the morning.  #Persistent leukocytosis likely due to dehydration, malignancy: Patient has no UTI, no pneumonia.  -Patient has no sign of infection.   Continue IV fluid.    Discontinue niacin, Lipitor as per patient's request.  DVT prophylaxis: Lovenox sq Code Status: DNR Family Communication: No family at bedside Disposition Plan: Currently admitted    Consultants:   Oncology  General surgery  Procedures: Feeding tube placement Antimicrobials: None  Subjective: Seen and examined at bedside.  Has feeding tube placement.  Denied nausea vomiting or abdominal pain. Objective: Vitals:   01/10/17 2043 01/11/17 0100 01/11/17 0600 01/11/17 1333  BP: (!) 144/68  (!) 142/73 (!) 153/75  Pulse: 66  (!) 53 (!) 55  Resp: 20  16 18   Temp: 99.1 F (37.3 C) 100.3 F (37.9 C) 98.8 F (37.1 C) 98.2 F (36.8 C)  TempSrc: Axillary Oral Oral Oral  SpO2: 98%  97% 100%  Weight:    75.5 kg (166 lb 7.2 oz)  Height:        Intake/Output Summary (Last 24 hours) at 01/11/2017 1454 Last data filed at 01/11/2017 1300 Gross per 24 hour  Intake 3218.33 ml  Output 700 ml  Net 2518.33 ml   Filed Weights   01/06/17 2125 01/11/17 1333  Weight: 72 kg (158 lb 11.7 oz) 75.5 kg (166 lb 7.2 oz)    Examination:  General exam: Thin cachectic male lying in bed comfortable, not in distress respiratory system: Clear bilateral, no wheezing or crackle. Cardiovascular system: regular rate rhythm S1-S2 normal. Gastrointestinal system: Abdomen soft, nontender, bowel sounds positive.  No sign of bleeding. Central nervous system: Alert awake and following commands. Skin: No rashes, lesions or ulcers Psychiatry: Judgement and insight appear normal. Mood & affect appropriate.     Data Reviewed: I have personally reviewed following labs and imaging studies  CBC: Recent Labs  Lab 01/05/17 1016  01/06/17 1739 01/06/17 1746 01/07/17 1040  01/09/17 0447 01/10/17 0356 01/11/17 0510  WBC 13.5*  --  14.9*  --  14.4* 16.1* 17.6* 18.4*  NEUTROABS 10.4*  --  11.3*  --   --   --   --   --   HGB 10.3*   < > 9.8* 9.2* 9.4* 10.7* 10.3* 9.7*  HCT 31.2*   < >  29.0* 27.0* 28.8* 32.8* 31.3* 29.8*  MCV 91.0  --  89.5  --  90.0 90.4 89.9 90.9  PLT 257  --  274  --  304 374 366 327   < > = values in this interval not displayed.   Basic Metabolic Panel: Recent Labs  Lab 01/05/17 1016  01/06/17 1739 01/06/17 1746 01/07/17 1040 01/09/17 0447 01/10/17 0356 01/11/17 0510  NA 140   < > 136 139 139 138 139 138  K 3.4*   < > 3.4* 3.5 3.4* 3.4* 3.6 3.3*  CL  --    < > 108 105 110 104 107 105  CO2 20*  --  22  --  22 23 24 28   GLUCOSE 94   < > 60* 58* 85 130* 179* 186*  BUN 28.0*   < > 19 16 13 7 8 7   CREATININE 0.9   < > 0.71 0.70 0.68 0.58* 0.61 0.61  CALCIUM 8.9  --  8.2*  --  8.2* 8.2* 8.0* 7.9*  MG 2.1  --  1.8  --   --  1.4* 1.7  --    < > = values in this interval not displayed.   GFR: Estimated Creatinine Clearance: 73.5 mL/min (by C-G formula based on SCr of 0.61 mg/dL). Liver Function Tests: Recent Labs  Lab 01/05/17 1016 01/06/17 1739 01/07/17 1040  AST 54* 70* 52*  ALT 28 43 40  ALKPHOS 86 67 73  BILITOT 0.74 0.8 0.6  PROT 5.7* 5.3* 5.3*  ALBUMIN 2.4* 2.3* 2.3*   Recent Labs  Lab 01/06/17 1739  LIPASE 35   No results for input(s): AMMONIA in the last 168 hours. Coagulation Profile: Recent Labs  Lab 01/07/17 1040  INR 1.41   Cardiac Enzymes: Recent Labs  Lab 01/07/17 2359  TROPONINI <0.03   BNP (last 3 results) No results for input(s): PROBNP in the last 8760 hours. HbA1C: No results for input(s): HGBA1C in the last 72 hours. CBG: Recent Labs  Lab 01/10/17 1338 01/10/17 1810 01/10/17 2147 01/11/17 0815 01/11/17 1229  GLUCAP 162* 159* 135* 181* 131*   Lipid Profile: No results for input(s): CHOL, HDL, LDLCALC, TRIG, CHOLHDL, LDLDIRECT in the last 72 hours. Thyroid Function Tests: Recent Labs    01/10/17 0356  TSH 1.310   Anemia Panel: No results for input(s): VITAMINB12, FOLATE, FERRITIN, TIBC, IRON, RETICCTPCT in the last 72 hours. Sepsis Labs: No results for input(s): PROCALCITON,  LATICACIDVEN in the last 168 hours.  Recent Results (from the past 240 hour(s))  Culture, Blood     Status: None   Collection Time: 01/04/17 11:57 AM  Result Value Ref Range Status   BLOOD CULTURE, ROUTINE Final report  Final   Organism ID, Bacteria Comment  Final    Comment: No aerobic or anaerobic growth in five days.  Culture, Blood     Status: None   Collection Time: 01/04/17 11:57 AM  Result Value Ref Range Status   BLOOD CULTURE, ROUTINE Final report  Final   Organism ID, Bacteria Comment  Final    Comment: No aerobic or anaerobic growth in five days.  TECHNOLOGIST REVIEW     Status: None   Collection Time: 01/05/17 10:16 AM  Result Value Ref Range Status   Technologist Review Rare meta, few ovalos  Final  Stool culture (children & immunocomp patients)     Status: None   Collection Time: 01/05/17 11:15 AM  Result Value Ref Range Status   Salmonella/Shigella Screen Final report  Final   Campylobacter Culture Final report  Final   E coli, Shiga toxin Assay Negative Negative Final    Comment: (NOTE) Performed At: [facility] [place], [place] [id number] Rush Farmer MD MK:[id number]   STOOL CULTURE REFLEX - RSASHR     Status: None   Collection Time: 01/05/17 11:15 AM  Result Value Ref Range Status   Stool Culture result 1 (RSASHR) Comment  Final    Comment: (NOTE) No Salmonella or Shigella recovered. Performed At: [facility] [address] [place], [place] [id number] Rush Farmer MD [id number]   STOOL CULTURE Reflex - CMPCXR     Status: None   Collection Time: 01/05/17 11:15 AM  Result Value Ref Range Status   Stool Culture result 1 (CMPCXR) Comment  Final    Comment: (NOTE) No Campylobacter species isolated. Performed At: [facility] [place], [place] [id number] Rush Farmer MD OF:[id number]   Surgical pcr screen     Status: None   Collection Time: 01/09/17 12:26 PM  Result Value Ref Range Status     MRSA, PCR NEGATIVE NEGATIVE Final   Staphylococcus aureus NEGATIVE NEGATIVE Final    Comment: (NOTE) The Xpert SA Assay (FDA approved for NASAL specimens in patients 38 years of age and older), is one component of a comprehensive surveillance program. It is not intended to diagnose infection nor to guide or monitor treatment.          Radiology Studies: No results found.      Scheduled Meds: . calcium-vitamin D  1 tablet Oral Daily  . feeding supplement (PRO-STAT SUGAR FREE 64)  30 mL Oral BID  . heparin  5,000 Units Subcutaneous Q8H  . insulin aspart  0-15 Units Subcutaneous TID WC  . insulin aspart  0-5 Units Subcutaneous QHS  . pantoprazole (PROTONIX) IV  40 mg Intravenous Q24H   Continuous Infusions: . dextrose 5 % and 0.45 % NaCl with KCl 20 mEq/L 75 mL/hr at 01/11/17 0538  . feeding supplement (OSMOLITE 1.5 CAL) 1,000 mL (01/11/17 1446)  . lactated ringers 10 mL/hr at 01/10/17 1014     LOS: 5 days    Cattleya Dobratz Tanna Furry, MD [facility] Pager [phone]  If 7PM-7AM, please contact night-coverage [email or web address] Password [email or web address] 01/11/2017, 2:54 PM

## 2017-01-11 NOTE — Care Management Important Message (Signed)
Important Message  Patient Details  Name: Cody Mckay MRN: [id number] Date of Birth: 11/16/1932   Medicare Important Message Given:  Yes    Kerin Salen 01/11/2017, 12:17 [place] Message  Patient Details  Name: Cody Mckay MRN: [id number] Date of Birth: 06-17-1932   Medicare Important Message Given:  Yes    Kerin Salen 01/11/2017, 12:17 PM

## 2017-01-11 NOTE — Progress Notes (Signed)
Per Dr. Hassell Done patient is to be strict NPO.  Any medication should be given IV.

## 2017-01-11 NOTE — Progress Notes (Signed)
Brief Nutrition Note  Consult received for enteral/tube feeding initiation and management. Adult Enteral Nutrition order placed.. Full assessment performed 12/7.   Provide Osmolite 1.5 @ 20 ml/hr increase by 10 ml Q4 hours to goal rate of 60 ml/hr via PEJ Provide 30 ml Prostat BID.   This provides pt with 2360 kcal, 120 g protein, 1094 ml free water. This meets 100% of protein and calorie needs.   Mariana Single RD, LDN Clinical Nutrition Pager # [phone]

## 2017-01-11 NOTE — Progress Notes (Signed)
Patient ID: Cody Mckay [place], male   DOB: January 20, 1933, 83 y.o.   MRN: [id number] [facility] Progress Note:   1 Day Post-Op  Subjective: Mental status is alert Objective: Vital signs in last 24 hours: Temp:  [98.2 F (36.8 C)-100.3 F (37.9 C)] 98.2 F (36.8 C) (12/11 1333) Pulse Rate:  [53-66] 55 (12/11 1333) Resp:  [16-20] 18 (12/11 1333) BP: (142-153)/(68-75) 153/75 (12/11 1333) SpO2:  [97 %-100 %] 100 % (12/11 1333) Weight:  [75.5 kg (166 lb 7.2 oz)] 75.5 kg (166 lb 7.2 oz) (12/11 1333)  Intake/Output from previous day: 12/10 0701 - 12/11 0700 In: 4118.3 [I.V.:4118.3] Out: 3400 [Urine:950; Blood:50] Intake/Output this shift: Total I/O In: 300 [IV Piggyback:300] Out: 100 [Urine:100]  Physical Exam: Work of breathing is normal.    Lab Results:  Results for orders placed or performed during the hospital encounter of 01/06/17 (from the past 48 hour(s))  Glucose, capillary     Status: Abnormal   Collection Time: 01/09/17  5:39 PM  Result Value Ref Range   Glucose-Capillary 141 (H) 65 - 99 mg/dL  Glucose, capillary     Status: Abnormal   Collection Time: 01/09/17  9:34 PM  Result Value Ref Range   Glucose-Capillary 127 (H) 65 - 99 mg/dL  Basic metabolic panel     Status: Abnormal   Collection Time: 01/10/17  3:56 AM  Result Value Ref Range   Sodium 139 135 - 145 mmol/L   Potassium 3.6 3.5 - 5.1 mmol/L   Chloride 107 101 - 111 mmol/L   CO2 24 22 - 32 mmol/L   Glucose, Bld 179 (H) 65 - 99 mg/dL   BUN 8 6 - 20 mg/dL   Creatinine, Ser 0.61 0.61 - 1.24 mg/dL   Calcium 8.0 (L) 8.9 - 10.3 mg/dL   GFR calc non Af Amer >60 >60 mL/min   GFR calc Af Amer >60 >60 mL/min    Comment: (NOTE) The eGFR has been calculated using the CKD EPI equation. This calculation has not been validated in all clinical situations. eGFR's persistently <60 mL/min signify possible Chronic Kidney Disease.    Anion gap 8 5 - 15  Magnesium     Status: None   Collection Time: 01/10/17  3:56  AM  Result Value Ref Range   Magnesium 1.7 1.7 - 2.4 mg/dL  CBC     Status: Abnormal   Collection Time: 01/10/17  3:56 AM  Result Value Ref Range   WBC 17.6 (H) 4.0 - 10.5 K/uL   RBC 3.48 (L) 4.22 - 5.81 MIL/uL   Hemoglobin 10.3 (L) 13.0 - 17.0 g/dL   HCT 31.3 (L) 39.0 - 52.0 %   MCV 89.9 78.0 - 100.0 fL   MCH 29.6 26.0 - 34.0 pg   MCHC 32.9 30.0 - 36.0 g/dL   RDW 15.2 11.5 - 15.5 %   Platelets 366 150 - 400 K/uL  TSH     Status: None   Collection Time: 01/10/17  3:56 AM  Result Value Ref Range   TSH 1.310 0.350 - 4.500 uIU/mL    Comment: Performed by a 3rd Generation assay with a functional sensitivity of <=0.01 uIU/mL.  Glucose, capillary     Status: Abnormal   Collection Time: 01/10/17  8:36 AM  Result Value Ref Range   Glucose-Capillary 178 (H) 65 - 99 mg/dL  Glucose, capillary     Status: Abnormal   Collection Time: 01/10/17  1:05 PM  Result Value Ref Range  Glucose-Capillary 161 (H) 65 - 99 mg/dL  Glucose, capillary     Status: Abnormal   Collection Time: 01/10/17  1:38 PM  Result Value Ref Range   Glucose-Capillary 162 (H) 65 - 99 mg/dL  Glucose, capillary     Status: Abnormal   Collection Time: 01/10/17  6:10 PM  Result Value Ref Range   Glucose-Capillary 159 (H) 65 - 99 mg/dL  Glucose, capillary     Status: Abnormal   Collection Time: 01/10/17  9:47 PM  Result Value Ref Range   Glucose-Capillary 135 (H) 65 - 99 mg/dL  CBC     Status: Abnormal   Collection Time: 01/11/17  5:10 AM  Result Value Ref Range   WBC 18.4 (H) 4.0 - 10.5 K/uL   RBC 3.28 (L) 4.22 - 5.81 MIL/uL   Hemoglobin 9.7 (L) 13.0 - 17.0 g/dL   HCT 29.8 (L) 39.0 - 52.0 %   MCV 90.9 78.0 - 100.0 fL   MCH 29.6 26.0 - 34.0 pg   MCHC 32.6 30.0 - 36.0 g/dL   RDW 15.2 11.5 - 15.5 %   Platelets 327 150 - 400 K/uL  Basic metabolic panel     Status: Abnormal   Collection Time: 01/11/17  5:10 AM  Result Value Ref Range   Sodium 138 135 - 145 mmol/L   Potassium 3.3 (L) 3.5 - 5.1 mmol/L   Chloride 105  101 - 111 mmol/L   CO2 28 22 - 32 mmol/L   Glucose, Bld 186 (H) 65 - 99 mg/dL   BUN 7 6 - 20 mg/dL   Creatinine, Ser 0.61 0.61 - 1.24 mg/dL   Calcium 7.9 (L) 8.9 - 10.3 mg/dL   GFR calc non Af Amer >60 >60 mL/min   GFR calc Af Amer >60 >60 mL/min    Comment: (NOTE) The eGFR has been calculated using the CKD EPI equation. This calculation has not been validated in all clinical situations. eGFR's persistently <60 mL/min signify possible Chronic Kidney Disease.    Anion gap 5 5 - 15  Glucose, capillary     Status: Abnormal   Collection Time: 01/11/17  8:15 AM  Result Value Ref Range   Glucose-Capillary 181 (H) 65 - 99 mg/dL   Comment 1 Notify RN   Glucose, capillary     Status: Abnormal   Collection Time: 01/11/17 12:29 PM  Result Value Ref Range   Glucose-Capillary 131 (H) 65 - 99 mg/dL  Glucose, capillary     Status: Abnormal   Collection Time: 01/11/17  4:19 PM  Result Value Ref Range   Glucose-Capillary 136 (H) 65 - 99 mg/dL    Radiology/Results: No results found.  Anti-infectives: Anti-infectives (From admission, onward)   Start     Dose/Rate Route Frequency Ordered Stop   01/10/17 2000  ceFAZolin (ANCEF) IVPB 2g/100 mL premix     2 g 200 mL/hr over 30 Minutes Intravenous Every 8 hours 01/10/17 1451 01/10/17 2042   01/10/17 1042  ceFAZolin (ANCEF) 2-4 GM/100ML-% IVPB    Comments:  [name], [name]  : cabinet override      01/10/17 1042 01/10/17 2259   01/10/17 0600  ceFAZolin (ANCEF) powder 1 g  Status:  Discontinued     1 g Other To Surgery 01/09/17 1007 01/10/17 1046      Assessment/Plan: Problem List: Patient Active Problem List   Diagnosis Date Noted  . Orthostatic dizziness   . Bradycardia   . Generalized weakness   . Severe protein-calorie malnutrition ([place])   .  Dehydration 01/06/2017  . DM (diabetes mellitus) ([place]) 01/04/2017  . Hyperlipidemia 01/04/2017  . Goals of care, counseling/discussion 12/23/2016  . Pancreatic adenocarcinoma ([place])  12/22/2016    Advised to begin trickle feeds through the feeding jejunostomy. 1 Day Post-Op    LOS: 5 days   Matt B. Hassell Done, MD, [facility] [phone] beeper [phone]  01/11/2017 4:39 PM

## 2017-01-11 NOTE — Telephone Encounter (Signed)
Please review the refill for lancets.

## 2017-01-11 NOTE — Progress Notes (Signed)
ELWIN TSOU   DOB:1932/12/10   ZO#:[id number]   JWJ#:[id number]  ONCOLOGY F/U NOTE  Subjective: Mr. Spong underwent G-tube placement yesterday, feels better overall after surgery. He has not started tube feeding yet   Objective:  Vitals:   01/11/17 0100 01/11/17 0600  BP:  (!) 142/73  Pulse:  (!) 53  Resp:  16  Temp: 100.3 F (37.9 C) 98.8 F (37.1 C)  SpO2:  97%    Body mass index is 22.78 kg/m.  Intake/Output Summary (Last 24 hours) at 01/11/2017 1023 Last data filed at 01/11/2017 0600 Gross per 24 hour  Intake 4118.33 ml  Output 3400 ml  Net 718.33 ml     Sclerae unicteric  Oropharynx clear  No peripheral adenopathy  Lungs clear -- no rales or rhonchi  Heart regular rate and rhythm  Abdomen benign, laparoscopic surgical incisions are healing well, (+) G-tube in LUQ of abdomen   MSK no focal spinal tenderness, no peripheral edema  Neuro nonfocal    CBG (last 3)  Recent Labs    01/10/17 1810 01/10/17 2147 01/11/17 0815  GLUCAP 159* 135* 181*     Labs:  Lab Results  Component Value Date   WBC 18.4 (H) 01/11/2017   HGB 9.7 (L) 01/11/2017   HCT 29.8 (L) 01/11/2017   MCV 90.9 01/11/2017   PLT 327 01/11/2017   NEUTROABS 11.3 (H) 01/06/2017   CMP Latest Ref Rng & Units 01/11/2017 01/10/2017 01/09/2017  Glucose 65 - 99 mg/dL 186(H) 179(H) 130(H)  BUN 6 - 20 mg/dL 7 8 7   Creatinine 0.61 - 1.24 mg/dL 0.61 0.61 0.58(L)  Sodium 135 - 145 mmol/L 138 139 138  Potassium 3.5 - 5.1 mmol/L 3.3(L) 3.6 3.4(L)  Chloride 101 - 111 mmol/L 105 107 104  CO2 22 - 32 mmol/L 28 24 23   Calcium 8.9 - 10.3 mg/dL 7.9(L) 8.0(L) 8.2(L)  Total Protein 6.5 - 8.1 g/dL - - -  Total Bilirubin 0.3 - 1.2 mg/dL - - -  Alkaline Phos 38 - 126 U/L - - -  AST 15 - 41 U/L - - -  ALT 17 - 63 U/L - - -     Urine Studies No results for input(s): UHGB, CRYS in the last 72 hours.  Invalid input(s): UACOL, UAPR, USPG, UPH, UTP, UGL, [place], UBIL, UNIT, Lacinda Axon [place], [place]  Basic Metabolic Panel: Recent Labs  Lab 01/05/17 1016  01/06/17 1739 01/06/17 1746 01/07/17 1040 01/09/17 0447 01/10/17 0356 01/11/17 0510  NA 140   < > 136 139 139 138 139 138  K 3.4*   < > 3.4* 3.5 3.4* 3.4* 3.6 3.3*  CL  --    < > 108 105 110 104 107 105  CO2 20*  --  22  --  22 23 24 28   GLUCOSE 94   < > 60* 58* 85 130* 179* 186*  BUN 28.0*   < > 19 16 13 7 8 7   CREATININE 0.9   < > 0.71 0.70 0.68 0.58* 0.61 0.61  CALCIUM 8.9  --  8.2*  --  8.2* 8.2* 8.0* 7.9*  MG 2.1  --  1.8  --   --  1.4* 1.7  --    < > = values in this interval not displayed.   GFR Estimated Creatinine Clearance: 72.5 mL/min (by C-G formula based on SCr of 0.61 mg/dL). Liver Function Tests: Recent Labs  Lab 01/04/17 1157 01/05/17 1016 01/06/17 1739 01/07/17  1040  AST 21 54* 70* 52*  ALT 15 28 43 40  ALKPHOS 74 86 67 73  BILITOT 1.15 0.74 0.8 0.6  PROT 6.0* 5.7* 5.3* 5.3*  ALBUMIN 2.6* 2.4* 2.3* 2.3*   Recent Labs  Lab 01/06/17 1739  LIPASE 35   No results for input(s): AMMONIA in the last 168 hours. Coagulation profile Recent Labs  Lab 01/07/17 1040  INR 1.41    CBC: Recent Labs  Lab 01/05/17 1016  01/06/17 1739 01/06/17 1746 01/07/17 1040 01/09/17 0447 01/10/17 0356 01/11/17 0510  WBC 13.5*  --  14.9*  --  14.4* 16.1* 17.6* 18.4*  NEUTROABS 10.4*  --  11.3*  --   --   --   --   --   HGB 10.3*   < > 9.8* 9.2* 9.4* 10.7* 10.3* 9.7*  HCT 31.2*   < > 29.0* 27.0* 28.8* 32.8* 31.3* 29.8*  MCV 91.0  --  89.5  --  90.0 90.4 89.9 90.9  PLT 257  --  274  --  304 374 366 327   < > = values in this interval not displayed.   Cardiac Enzymes: Recent Labs  Lab 01/07/17 2359  TROPONINI <0.03   BNP: Invalid input(s): POCBNP CBG: Recent Labs  Lab 01/10/17 1305 01/10/17 1338 01/10/17 1810 01/10/17 2147 01/11/17 0815  GLUCAP 161* 162* 159* 135* 181*   D-Dimer No results for input(s): DDIMER in the last 72 hours. Hgb A1c No results for input(s): HGBA1C  in the last 72 hours. Lipid Profile No results for input(s): CHOL, HDL, LDLCALC, TRIG, CHOLHDL, LDLDIRECT in the last 72 hours. Thyroid function studies Recent Labs    01/10/17 0356  TSH 1.310   Anemia work up No results for input(s): VITAMINB12, FOLATE, FERRITIN, TIBC, IRON, RETICCTPCT in the last 72 hours. Microbiology Recent Results (from the past 240 hour(s))  Culture, Blood     Status: None   Collection Time: 01/04/17 11:57 AM  Result Value Ref Range Status   BLOOD CULTURE, ROUTINE Final report  Final   Organism ID, Bacteria Comment  Final    Comment: No aerobic or anaerobic growth in five days.  Culture, Blood     Status: None   Collection Time: 01/04/17 11:57 AM  Result Value Ref Range Status   BLOOD CULTURE, ROUTINE Final report  Final   Organism ID, Bacteria Comment  Final    Comment: No aerobic or anaerobic growth in five days.  TECHNOLOGIST REVIEW     Status: None   Collection Time: 01/05/17 10:16 AM  Result Value Ref Range Status   Technologist Review Rare meta, few ovalos  Final  Stool culture (children & immunocomp patients)     Status: None   Collection Time: 01/05/17 11:15 AM  Result Value Ref Range Status   Salmonella/Shigella Screen Final report  Final   Campylobacter Culture Final report  Final   E coli, Shiga toxin Assay Negative Negative Final    Comment: (NOTE) Performed At: [facility] [place], [place] [id number] Rush Farmer MD OE:[id number]   STOOL CULTURE REFLEX - RSASHR     Status: None   Collection Time: 01/05/17 11:15 AM  Result Value Ref Range Status   Stool Culture result 1 (RSASHR) Comment  Final    Comment: (NOTE) No Salmonella or Shigella recovered. Performed At: [facility] [address] [place], [place] [id number] Rush Farmer MD PP:[id number]   STOOL CULTURE Reflex - CMPCXR     Status: None  Collection Time: 01/05/17 11:15 AM  Result Value Ref Range Status   Stool Culture result 1 (CMPCXR)  Comment  Final    Comment: (NOTE) No Campylobacter species isolated. Performed At: [facility] [place], [place] [id number] Rush Farmer MD XB:[id number]   Surgical pcr screen     Status: None   Collection Time: 01/09/17 12:26 PM  Result Value Ref Range Status   MRSA, PCR NEGATIVE NEGATIVE Final   Staphylococcus aureus NEGATIVE NEGATIVE Final    Comment: (NOTE) The Xpert SA Assay (FDA approved for NASAL specimens in patients 45 years of age and older), is one component of a comprehensive surveillance program. It is not intended to diagnose infection nor to guide or monitor treatment.       Studies:  No results found.  Assessment: 83 y.o. male with a history of abnormal EKG, right bundle branch block, bleeding gastric ulcer, Diverticulosis, DM, and HTN, who was recently diagnosed with pancreatic head adenocarcinoma   1. 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  Partial duodenal obstruction secondary to #1, s/p G-tube placement 12/10 3.  Nausea, vomiting, dehydration, improved  4. DM 5. HTN 6. GERD 7.  Severe protein and calorie malnutrition 8. DNR/DNI   Recommendations -I assume he will start tube feeds soon  -pt is eager to restart chemo, I recommend him to focus on nutrition and recovery first -I plan to see him back in my clinic next week and I will revisit the option of chemotherapy, if he recovers well -Continue other supportive care.  Truitt Merle, MD 01/11/2017  10:23 AM

## 2017-01-11 NOTE — Progress Notes (Signed)
1 Day Post-Op    CC: GOO secondary to pancreatic cancer  Subjective: Doing well after procedure.  Discussed Tube feedings and no pills via jejunostomy tube  Objective: Vital signs in last 24 hours: Temp:  [97.7 F (36.5 C)-100.3 F (37.9 C)] 98.8 F (37.1 C) (12/11 0600) Pulse Rate:  [53-67] 53 (12/11 0600) Resp:  [16-20] 16 (12/11 0600) BP: (142-169)/(68-74) 142/73 (12/11 0600) SpO2:  [95 %-98 %] 97 % (12/11 0600) Last BM Date: 01/10/17 N.p.o. For 118 IV 950 urine 2400 other -  TM 100.3 K+ 3.3 WBC 18.4  Intake/Output from previous day: 12/10 0701 - 12/11 0700 In: 4118.3 [I.V.:4118.3] Out: 3400 [Urine:950; Blood:50] Intake/Output this shift: Total I/O In: 300 [IV Piggyback:300] Out: 100 [Urine:100]  General appearance: alert, cooperative and no distress GI: soft, non-tender; bowel sounds normal; no masses,  no organomegaly and Jejunostomy site is clean and dry.  Lab Results:  Recent Labs    01/10/17 0356 01/11/17 0510  WBC 17.6* 18.4*  HGB 10.3* 9.7*  HCT 31.3* 29.8*  PLT 366 327    BMET Recent Labs    01/10/17 0356 01/11/17 0510  NA 139 138  K 3.6 3.3*  CL 107 105  CO2 24 28  GLUCOSE 179* 186*  BUN 8 7  CREATININE 0.61 0.61  CALCIUM 8.0* 7.9*   PT/INR No results for input(s): LABPROT, INR in the last 72 hours.  Recent Labs  Lab 01/05/17 1016 01/06/17 1739 01/07/17 1040  AST 54* 70* 52*  ALT 28 43 40  ALKPHOS 86 67 73  BILITOT 0.74 0.8 0.6  PROT 5.7* 5.3* 5.3*  ALBUMIN 2.4* 2.3* 2.3*     Lipase     Component Value Date/Time   LIPASE 35 01/06/2017 1739     Medications: . calcium-vitamin D  1 tablet Oral Daily  . feeding supplement (PRO-STAT SUGAR FREE 64)  30 mL Oral BID  . heparin  5,000 Units Subcutaneous Q8H  . insulin aspart  0-15 Units Subcutaneous TID WC  . insulin aspart  0-5 Units Subcutaneous QHS  . pantoprazole (PROTONIX) IV  40 mg Intravenous Q24H   . dextrose 5 % and 0.45 % NaCl with KCl 20 mEq/L 75 mL/hr at  01/11/17 0538  . feeding supplement (OSMOLITE 1.5 CAL)    . lactated ringers 10 mL/hr at 01/10/17 1014   Anti-infectives (From admission, onward)   Start     Dose/Rate Route Frequency Ordered Stop   01/10/17 2000  ceFAZolin (ANCEF) IVPB 2g/100 mL premix     2 g 200 mL/hr over 30 Minutes Intravenous Every 8 hours 01/10/17 1451 01/10/17 2042   01/10/17 1042  ceFAZolin (ANCEF) 2-4 GM/100ML-% IVPB    Comments:  [name], [name]  : cabinet override      01/10/17 1042 01/10/17 2259   01/10/17 0600  ceFAZolin (ANCEF) powder 1 g  Status:  Discontinued     1 g Other To Surgery 01/09/17 1007 01/10/17 1046      Assessment/Plan Unresectable pancreatic cancer, head of pancreas cT2N1M0,stage IIB, Partial duodenal obstruction -stomach was full yesterday in the OR. Severe dehydration and malnutrition   Type 2 diabetes Hypertension GERD DNR/DNI  FEN:  IV fluids/starting Trickle feeds today DVT: SCD ID: Preop Ancef Follow-up: Dr. Hassell Done as needed   Plan: Trickle tube feeds to start today.  No pills of any type through the jejunostomy tube.  He needs to remain n.p.o.  Stomach was full of fluid at the time of surgery.  LOS: 5 days    Cody Mckay 01/11/[id number]

## 2017-01-11 NOTE — Progress Notes (Signed)
These preliminary result these preliminary results were noted.  Awaiting final report.

## 2017-01-12 ENCOUNTER — Telehealth: Payer: Self-pay | Admitting: Hematology

## 2017-01-12 LAB — CBC
HEMATOCRIT: 31.1 % — AB (ref 39.0–52.0)
Hemoglobin: 10.2 g/dL — ABNORMAL LOW (ref 13.0–17.0)
MCH: 29.7 pg (ref 26.0–34.0)
MCHC: 32.8 g/dL (ref 30.0–36.0)
MCV: 90.4 fL (ref 78.0–100.0)
PLATELETS: 318 10*3/uL (ref 150–400)
RBC: 3.44 MIL/uL — ABNORMAL LOW (ref 4.22–5.81)
RDW: 15.2 % (ref 11.5–15.5)
WBC: 17.6 10*3/uL — ABNORMAL HIGH (ref 4.0–10.5)

## 2017-01-12 LAB — BASIC METABOLIC PANEL
ANION GAP: 5 (ref 5–15)
BUN: 6 mg/dL (ref 6–20)
CALCIUM: 7.7 mg/dL — AB (ref 8.9–10.3)
CO2: 29 mmol/L (ref 22–32)
Chloride: 102 mmol/L (ref 101–111)
Creatinine, Ser: 0.52 mg/dL — ABNORMAL LOW (ref 0.61–1.24)
GLUCOSE: 152 mg/dL — AB (ref 65–99)
POTASSIUM: 3.2 mmol/L — AB (ref 3.5–5.1)
Sodium: 136 mmol/L (ref 135–145)

## 2017-01-12 LAB — GLUCOSE, CAPILLARY
GLUCOSE-CAPILLARY: 182 mg/dL — AB (ref 65–99)
GLUCOSE-CAPILLARY: 164 mg/dL — AB (ref 65–99)
Glucose-Capillary: 155 mg/dL — ABNORMAL HIGH (ref 65–99)
Glucose-Capillary: 174 mg/dL — ABNORMAL HIGH (ref 65–99)
Glucose-Capillary: 158 mg/dL — ABNORMAL HIGH (ref 65–99)
Glucose-Capillary: 198 mg/dL — ABNORMAL HIGH (ref 65–99)

## 2017-01-12 MED ORDER — PROCHLORPERAZINE EDISYLATE 5 MG/ML IJ SOLN
10.0000 mg | Freq: Once | INTRAMUSCULAR | Status: AC
  Administered 2017-01-12: 10 mg via INTRAVENOUS
  Filled 2017-01-12: qty 2

## 2017-01-12 MED ORDER — ONETOUCH ULTRASOFT LANCETS MISC: 12 refills | Status: DC

## 2017-01-12 MED ORDER — HYDRALAZINE HCL 20 MG/ML IJ SOLN: 5.0000 mg | Freq: Three times a day (TID) | INTRAMUSCULAR | Status: DC | PRN

## 2017-01-12 MED ORDER — MORPHINE SULFATE (PF) 4 MG/ML IV SOLN
2.0000 mg | Freq: Four times a day (QID) | INTRAVENOUS | Status: DC | PRN
  Administered 2017-01-12: 2 mg via INTRAVENOUS
  Filled 2017-01-12 (×3): qty 1

## 2017-01-12 NOTE — Telephone Encounter (Signed)
Scheduled appt per 12/12 sch msg - attempted to leave message for patient regarding appts - they do not have a voicemail.

## 2017-01-12 NOTE — Telephone Encounter (Signed)
Called daughter and left voicemail regarding the patient's appointments.

## 2017-01-12 NOTE — Telephone Encounter (Signed)
01/12/17 Faxed FMLA to [employer] @ [phone] successfully @ 3:37 pm.  Alycia Rossetti @ [phone] @ 4:18 and imformed her that paperwork was faxed.  She requested a copy be sent to her @ [address] [place], [place] [address]

## 2017-01-12 NOTE — Telephone Encounter (Signed)
Refill sent.

## 2017-01-12 NOTE — Progress Notes (Signed)
Patient requesting pain medication.  Dr. Dyann Kief notified via text page for something IV d/t strict NPO and no meds allowed in Jtube.

## 2017-01-12 NOTE — Progress Notes (Signed)
PROGRESS NOTE    Cody Mckay  EUM:[id number] DOB: 05-Jun-1932 DOA: 01/06/2017 PCP: Dorothyann Peng, NP   Brief Narrative: 83 y.o. 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  Assessment & Plan:  #Severe protein calorie malnutrition, dehydration due to poor intake in the setting of pancreatic malignancy: -Evaluated by oncologist and general surgery.  Status post jejunostomy feeding tube placement by general surgery on 12/10.   -continue advancing tube feeding velocity.  -Patient is to be strict n.p.o. per general surgery.   -continue to monitor tube feeding. -follow clinical response -palliative care consulted.   #Pancreatic cancer, adenocarcinoma in head of pancreas: Unresectable.  There is partial to complete duodenal obstruction secondary to tumor.   -As per oncology, it was reviewed with GI Dr. Ardis Hughs, who doesn't think this is feasible for endoscopic stenting. -Continue supportive care and symptom management. -palliative care consulted.  #Dizziness likely contributed by orthostatic hypotension and bradycardia: Received IV fluid bolus and hydration with improvement in symptoms.   -CT scan of head with no acute finding.   -Echo unremarkable.   -no abnormalities seen on telemetry  -seen by PT/OT and demonstrated good balance and no complaints with ambulation.  #Type 2 diabetes: Continue sliding scale.  Monitor blood sugar level.  #Hypertension:  -Stable and well-controlled -Will monitor and continue as needed hydralazine -No antihypertensive regimen has been required.    #GERD:  -Continue PPI -Currently IV given strict NPO status  # Hypokalemia:  -Repleted -Continue intermittently checking electrolytes trend for now. -Receiving IV fluids with some potassium in it. -Patient also on tube  feedings now.    #Persistent leukocytosis likely due to demargination, dehydration and malignancy:  -No signs of UTI or pneumonia on workup -Patient is afebrile  -Continue supportive care.   #hyperlipidemia -Will discontinue niacin and Lipitor at this moment -Currently unable to take medications with current available route   DVT prophylaxis: Lovenox sq Code Status: DNR Family Communication: Wife and daughter at bedside Disposition Plan: Remain inpatient; following discussion with family members plan is for palliative care consult.  They understand overall poor prognosis and current cardiac situation.  Most likely good candidate for home with hospice.   Consultants:   Oncology  General surgery  Procedures: Feeding tube placement Antimicrobials: None  Subjective: Afebrile, no nausea, no vomiting, no chest pain or shortness of breath.  So far tolerating tube feedings.  Complaining of some intermittent abdominal discomfort.    Objective: Vitals:   01/11/17 1333 01/11/17 2149 01/12/17 0436 01/12/17 1406  BP: (!) 153/75 (!) 151/79 (!) 156/73 (!) 144/68  Pulse: (!) 55 62 69 73  Resp: 18 18 18 18   Temp: 98.2 F (36.8 C) 98.6 F (37 C) (!) 97.5 F (36.4 C) 97.6 F (36.4 C)  TempSrc: Oral Oral Oral Oral  SpO2: 100% 95% 95% 94%  Weight: 75.5 kg (166 lb 7.2 oz)  72.9 kg (160 lb 11.5 oz)   Height:        Intake/Output Summary (Last 24 hours) at 01/12/2017 1846 Last data filed at 01/12/2017 1800 Gross per 24 hour  Intake 2020 ml  Output 350 ml  Net 1670 ml   Filed Weights   01/06/17 2125 01/11/17 1333 01/12/17 0436  Weight: 72 kg (158 lb 11.7 oz) 75.5 kg (166 lb 7.2 oz) 72.9 kg (160 lb 11.5 oz)    Examination: General  exam: Cachectic, frail and underweight in appearance; afebrile and in no acute distress.  Patient reports some intermittent abdominal discomfort, but denies chest pain, shortness of breath, nausea, vomiting or any other complaints.  He reported passing some  gas but denies any bowel movements.   Respiratory system: Good air movement bilaterally, no wheezing, no crackles, good oxygen saturation on room air.   Cardiovascular system: RRR, soft systolic ejection murmur, no rubs, no gallops.  Gastrointestinal system: Soft, mild tenderness to palpation, no guarding, positive bowel sounds, jejunostomy tube in place without any surrounding erythema or drainage on exam.   Central nervous system: Alert, awake and oriented x3; nerves grossly intact, no focal deficit appreciated on exam.  Skin: No rashes, no petechiae, no open ulcers; jejunostomy tube in place.   Psychiatry: Appropriate judgment and insight, no hallucinations.  Appropriate mood on exam.   Data Reviewed: I have personally reviewed following labs and imaging studies  CBC: Recent Labs  Lab 01/06/17 1739  01/07/17 1040 01/09/17 0447 01/10/17 0356 01/11/17 0510 01/12/17 0400  WBC 14.9*  --  14.4* 16.1* 17.6* 18.4* 17.6*  NEUTROABS 11.3*  --   --   --   --   --   --   HGB 9.8*   < > 9.4* 10.7* 10.3* 9.7* 10.2*  HCT 29.0*   < > 28.8* 32.8* 31.3* 29.8* 31.1*  MCV 89.5  --  90.0 90.4 89.9 90.9 90.4  PLT 274  --  304 374 366 327 318   < > = values in this interval not displayed.   Basic Metabolic Panel: Recent Labs  Lab 01/06/17 1739  01/07/17 1040 01/09/17 0447 01/10/17 0356 01/11/17 0510 01/12/17 0400  NA 136   < > 139 138 139 138 136  K 3.4*   < > 3.4* 3.4* 3.6 3.3* 3.2*  CL 108   < > 110 104 107 105 102  CO2 22  --  22 23 24 28 29   GLUCOSE 60*   < > 85 130* 179* 186* 152*  BUN 19   < > 13 7 8 7 6   CREATININE 0.71   < > 0.68 0.58* 0.61 0.61 0.52*  CALCIUM 8.2*  --  8.2* 8.2* 8.0* 7.9* 7.7*  MG 1.8  --   --  1.4* 1.7  --   --    < > = values in this interval not displayed.   GFR: Estimated Creatinine Clearance: 73.4 mL/min (A) (by C-G formula based on SCr of 0.52 mg/dL (L)).   Liver Function Tests: Recent Labs  Lab 01/06/17 1739 01/07/17 1040  AST 70* 52*  ALT 43 40    ALKPHOS 67 73  BILITOT 0.8 0.6  PROT 5.3* 5.3*  ALBUMIN 2.3* 2.3*   Recent Labs  Lab 01/06/17 1739  LIPASE 35   Coagulation Profile: Recent Labs  Lab 01/07/17 1040  INR 1.41   Cardiac Enzymes: Recent Labs  Lab 01/07/17 2359  TROPONINI <0.03   CBG: Recent Labs  Lab 01/12/17 0434 01/12/17 0758 01/12/17 1120 01/12/17 1333 01/12/17 1657  GLUCAP 155* 174* 158* 198* 182*   Thyroid Function Tests: Recent Labs    01/10/17 0356  TSH 1.310    Recent Results (from the past 240 hour(s))  Culture, Blood     Status: None   Collection Time: 01/04/17 11:57 AM  Result Value Ref Range Status   BLOOD CULTURE, ROUTINE Final report  Final   Organism ID, Bacteria Comment  Final    Comment: No  aerobic or anaerobic growth in five days.  Culture, Blood     Status: None   Collection Time: 01/04/17 11:57 AM  Result Value Ref Range Status   BLOOD CULTURE, ROUTINE Final report  Final   Organism ID, Bacteria Comment  Final    Comment: No aerobic or anaerobic growth in five days.  TECHNOLOGIST REVIEW     Status: None   Collection Time: 01/05/17 10:16 AM  Result Value Ref Range Status   Technologist Review Rare meta, few ovalos  Final  Stool culture (children & immunocomp patients)     Status: None   Collection Time: 01/05/17 11:15 AM  Result Value Ref Range Status   Salmonella/Shigella Screen Final report  Final   Campylobacter Culture Final report  Final   E coli, Shiga toxin Assay Negative Negative Final    Comment: (NOTE) Performed At: [facility] [place], [place] [id number] Rush Farmer MD ID:[id number]   STOOL CULTURE REFLEX - RSASHR     Status: None   Collection Time: 01/05/17 11:15 AM  Result Value Ref Range Status   Stool Culture result 1 (RSASHR) Comment  Final    Comment: (NOTE) No Salmonella or Shigella recovered. Performed At: [facility] [address] [place], [place] [id number] Rush Farmer MD QP:[id number]   STOOL  CULTURE Reflex - CMPCXR     Status: None   Collection Time: 01/05/17 11:15 AM  Result Value Ref Range Status   Stool Culture result 1 (CMPCXR) Comment  Final    Comment: (NOTE) No Campylobacter species isolated. Performed At: [facility] [place], [place] [id number] Rush Farmer MD PJ:[id number]   Surgical pcr screen     Status: None   Collection Time: 01/09/17 12:26 PM  Result Value Ref Range Status   MRSA, PCR NEGATIVE NEGATIVE Final   Staphylococcus aureus NEGATIVE NEGATIVE Final    Comment: (NOTE) The Xpert SA Assay (FDA approved for NASAL specimens in patients 48 years of age and older), is one component of a comprehensive surveillance program. It is not intended to diagnose infection nor to guide or monitor treatment.      Scheduled Meds: . enoxaparin (LOVENOX) injection  40 mg Subcutaneous Q24H  . feeding supplement (PRO-STAT SUGAR FREE 64)  30 mL Oral BID  . insulin aspart  0-15 Units Subcutaneous Q4H  . pantoprazole (PROTONIX) IV  40 mg Intravenous Q24H   Continuous Infusions: . dextrose 5 % and 0.45 % NaCl with KCl 20 mEq/L 75 mL/hr at 01/12/17 1125  . feeding supplement (OSMOLITE 1.5 CAL) 1,000 mL (01/12/17 1612)  . lactated ringers 10 mL/hr at 01/10/17 1014     LOS: 6 days    Barton Dubois, MD [facility] Pager [phone]  If 7PM-7AM, please contact night-coverage [email or web address] Password [facility] 01/12/2017, 6:46 PM

## 2017-01-12 NOTE — Progress Notes (Signed)
[facility] Progress Note  2 Days Post-Op  Subjective: CC:  Denies pain currently - received morphine this AM. Denies nausea or vomiting. Denies bowel movements.   Per nurse there was some confusion about medications allowed per feeding tube. I discussed meds with the patient - he MAY have LIQUID meds per j-tube. NO crushed or whole pills per tube.   Objective: Vital signs in last 24 hours: Temp:  [97.5 F (36.4 C)-98.6 F (37 C)] 97.5 F (36.4 C) (12/12 0436) Pulse Rate:  [55-69] 69 (12/12 0436) Resp:  [18] 18 (12/12 0436) BP: (151-156)/(73-79) 156/73 (12/12 0436) SpO2:  [95 %-100 %] 95 % (12/12 0436) Weight:  [72.9 kg (160 lb 11.5 oz)-75.5 kg (166 lb 7.2 oz)] 72.9 kg (160 lb 11.5 oz) (12/12 0436) Last BM Date: 01/10/17  Intake/Output from previous day: 12/11 0701 - 12/12 0700 In: 1264.7 [I.V.:900; NG/GT:64.7; IV Piggyback:300] Out: 200 [Urine:200] Intake/Output this shift: No intake/output data recorded.  PE: General appearance: alert, cooperative and no distress GI: soft, non-tender; bowel sounds normal; no masses,  no organomegaly and Jejunostomy site is clean and dry.  Lab Results:  Recent Labs    01/11/17 0510 01/12/17 0400  WBC 18.4* 17.6*  HGB 9.7* 10.2*  HCT 29.8* 31.1*  PLT 327 318   BMET Recent Labs    01/11/17 0510 01/12/17 0400  NA 138 136  K 3.3* 3.2*  CL 105 102  CO2 28 29  GLUCOSE 186* 152*  BUN 7 6  CREATININE 0.61 0.52*  CALCIUM 7.9* 7.7*   CMP     Component Value Date/Time   NA 136 01/12/2017 0400   NA 140 01/05/2017 1016   K 3.2 (L) 01/12/2017 0400   K 3.4 (L) 01/05/2017 1016   CL 102 01/12/2017 0400   CO2 29 01/12/2017 0400   CO2 20 (L) 01/05/2017 1016   GLUCOSE 152 (H) 01/12/2017 0400   GLUCOSE 94 01/05/2017 1016   BUN 6 01/12/2017 0400   BUN 28.0 (H) 01/05/2017 1016   CREATININE 0.52 (L) 01/12/2017 0400   CREATININE 0.9 01/05/2017 1016   CALCIUM 7.7 (L) 01/12/2017 0400   CALCIUM 8.9 01/05/2017 1016   PROT  5.3 (L) 01/07/2017 1040   PROT 5.7 (L) 01/05/2017 1016   ALBUMIN 2.3 (L) 01/07/2017 1040   ALBUMIN 2.4 (L) 01/05/2017 1016   AST 52 (H) 01/07/2017 1040   AST 54 (H) 01/05/2017 1016   ALT 40 01/07/2017 1040   ALT 28 01/05/2017 1016   ALKPHOS 73 01/07/2017 1040   ALKPHOS 86 01/05/2017 1016   BILITOT 0.6 01/07/2017 1040   BILITOT 0.74 01/05/2017 1016   GFRNONAA >60 01/12/2017 0400   GFRAA >60 01/12/2017 0400   Lipase     Component Value Date/Time   LIPASE 35 01/06/2017 1739   Anti-infectives: Anti-infectives (From admission, onward)   Start     Dose/Rate Route Frequency Ordered Stop   01/10/17 2000  ceFAZolin (ANCEF) IVPB 2g/100 mL premix     2 g 200 mL/hr over 30 Minutes Intravenous Every 8 hours 01/10/17 1451 01/10/17 2042   01/10/17 1042  ceFAZolin (ANCEF) 2-4 GM/100ML-% IVPB    Comments:  [name], [name]  : cabinet override      01/10/17 1042 01/10/17 2259   01/10/17 0600  ceFAZolin (ANCEF) powder 1 g  Status:  Discontinued     1 g Other To Surgery 01/09/17 1007 01/10/17 1046     Assessment/Plan Unresectable pancreatic adenocarcinoma, head of pancreas cT2N1M0,stage IIB, Partial duodenal  obstruction  Severe dehydration and malnutrition  Type 2 diabetes Hypertension GERD DNR/DNI  FEN:  IV fluids; trickle TF startes 12/11, currently up to 15 cc/hr. Ok to advance to goal as patient toleates. DVT: SCD ID: Preop Ancef Follow-up: Dr. Hassell Done as needed  Plan: No pills of any type through the jejunostomy tube, LIQUID medication is acceptable.  He needs to remain n.p.o.  Stomach was full of fluid at the time of surgery.      LOS: 6 days    [facility] 01/12/2017, 8:55 AM Pager: [phone] Consults: [phone] Mon-Fri 7:00 am-4:30 pm Sat-Sun 7:00 am-11:30 am

## 2017-01-12 NOTE — Progress Notes (Signed)
Date: January 12, 2017 Velva Harman, BSN, [place], [place] Chart and notes review for patient progress and needs. POD 1 GOO due to pancreatic cancer Will follow for case management and discharge needs. Next review date: [id number]

## 2017-01-12 NOTE — Evaluation (Signed)
Physical Therapy Evaluation Patient Details Name: Cody Mckay MRN: [id number] DOB: [birth year] Today's Date: 01/12/2017   History of Present Illness  83 yo male admitted with dehydration, dizziness, s/p jejunostomy 01/10/17. Hx of pancreatic cancer-on chemo, dm  Clinical Impression  On eval, pt was Min guard assist for mobility. He walked ~125 feet with a RW. Pt denied dizziness during session. Used a RW for ambulation safety. Recommend daily ambulation in hallway with nursing as tolerated. Recommend HHPT and a RW, if pt and family are agreeable. Will continue to follow.     Follow Up Recommendations Home health PT;Supervision/Assistance - 24 hour    Equipment Recommendations  Rolling walker with 5" wheels    Recommendations for Other Services       Precautions / Restrictions Precautions Precautions: Fall Precaution Comments: feeding tube Restrictions Weight Bearing Restrictions: No      Mobility  Bed Mobility Overal bed mobility: Needs Assistance Bed Mobility: Supine to Sit;Sit to Supine     Supine to sit: Supervision;HOB elevated Sit to supine: Supervision;HOB elevated   General bed mobility comments: for safety, lines  Transfers Overall transfer level: Needs assistance Equipment used: Rolling walker (2 wheeled) Transfers: Sit to/from Stand Sit to Stand: Min guard;From elevated surface         General transfer comment: close guard for safety. VCs hand placement.   Ambulation/Gait Ambulation/Gait assistance: Min guard Ambulation Distance (Feet): 125 Feet Assistive device: Rolling walker (2 wheeled) Gait Pattern/deviations: Step-through pattern;Decreased stride length     General Gait Details: close guard for safety. Pt denied dizziness.   Stairs            Wheelchair Mobility    Modified Rankin (Stroke Patients Only)       Balance Overall balance assessment: Needs assistance           Standing balance-Leahy Scale: Fair                                Pertinent Vitals/Pain Pain Assessment: No/denies pain    Home Living Family/patient expects to be discharged to:: Private residence Living Arrangements: Spouse/significant other;Other relatives Available Help at Discharge: Family Type of Home: House         Home Equipment: None      Prior Function Level of Independence: Independent               Hand Dominance        Extremity/Trunk Assessment   Upper Extremity Assessment Upper Extremity Assessment: Generalized weakness    Lower Extremity Assessment Lower Extremity Assessment: Generalized weakness    Cervical / Trunk Assessment Cervical / Trunk Assessment: Kyphotic  Communication   Communication: No difficulties  Cognition Arousal/Alertness: Awake/alert Behavior During Therapy: WFL for tasks assessed/performed Overall Cognitive Status: Within Functional Limits for tasks assessed                                        General Comments      Exercises     Assessment/Plan    PT Assessment Patient needs continued PT services  PT Problem List Decreased strength;Decreased mobility;Decreased activity tolerance;Decreased balance;Decreased knowledge of use of DME       PT Treatment Interventions Gait training;Functional mobility training;Therapeutic activities    PT Goals (Current goals can be found in the Care Plan section)  Acute Rehab  PT Goals Patient Stated Goal: none stated PT Goal Formulation: With patient Time For Goal Achievement: 01/26/17 Potential to Achieve Goals: Good    Frequency Min 3X/week   Barriers to discharge        Co-evaluation               AM-PAC PT "6 Clicks" Daily Activity  Outcome Measure Difficulty turning over in bed (including adjusting bedclothes, sheets and blankets)?: A Little Difficulty moving from lying on back to sitting on the side of the bed? : A Little Difficulty sitting down on and standing up from a  chair with arms (e.g., wheelchair, bedside commode, etc,.)?: A Little Help needed moving to and from a bed to chair (including a wheelchair)?: A Little Help needed walking in hospital room?: A Little Help needed climbing 3-5 steps with a railing? : A Little 6 Click Score: 18    End of Session   Activity Tolerance: Patient tolerated treatment well Patient left: in bed;with call bell/phone within reach;with bed alarm set   PT Visit Diagnosis: Muscle weakness (generalized) (M62.81);Difficulty in walking, not elsewhere classified (R26.2)    Time: 8381-8403 PT Time Calculation (min) (ACUTE ONLY): 18 min   Charges:   PT Evaluation $PT Eval Moderate Complexity: 1 Mod     PT G Codes:        Weston Anna, MPT Pager: [phone]

## 2017-01-12 NOTE — Progress Notes (Addendum)
Nutrition Follow-up  DOCUMENTATION CODES:   Severe malnutrition in context of chronic illness  INTERVENTION:   Provide Osmolite 1.5 @ 20 ml/hr increase by 10 ml Q6 hours to goal rate of 60 ml/hr via PEJ Provide 30 ml Prostat BID.   NUTRITION DIAGNOSIS:   Severe Malnutrition related to cancer and cancer related treatments(pancreatic) as evidenced by severe fat depletion, severe muscle depletion, moderate muscle depletion, moderate fat depletion.  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 Pt presents this admission with poor appetite and fluid intake. Over the last three days pt was seen at Dr. Ernestina Penna office for IV fluids and potassium repletion. Pt interested in J tube placement. CCS notes placement likely scheduled for 12/10.    Spoke with nursing. Pt currently receiving Osmolite 1.5 @ 15 ml/hr, tolerating well. Surgery would like rate to be increased by 10 ml Q6 hours. Pt denies nausea/vomiting/abdminal pain. Pt remains NPO. No pills via PEJ tube. Will continue to increase rate per pt's toleration.   Weight noted to be up 2 lb since last RD visit. No recent BM recorded. CBG WNL.   Medications reviewed and include: SSI, NaCl with KCl & D5 @ 75 ml/hr Labs reviewed: K 3.2 (L)   Diet Order:  Diet NPO time specified  EDUCATION NEEDS:   Not appropriate for education at this time  Skin:  Skin Assessment: Reviewed RN Assessment  Last BM:  01/10/17  Height:   Ht Readings from Last 1 Encounters:  01/06/17 5\' 10"  (1.778 m)     Weight:   Wt Readings from Last 1 Encounters:  01/12/17 160 lb 11.5 oz (72.9 kg)    Ideal Body Weight:  75.5 kg  BMI:  Body mass index is 23.06 kg/m.  Estimated Nutritional Needs:   Kcal:  2250-2450 kcal/day  Protein:  115-125 g/day  Fluid:  >2.1 L/day    Mariana Single RD, LDN Clinical Nutrition Pager # - [phone]

## 2017-01-12 NOTE — Progress Notes (Signed)
Occupational Therapy Evaluation Patient Details Name: Cody Mckay MRN: [id number] DOB: [birth year] Today's Date: 01/12/2017    History of Present Illness 83 yo male admitted with dehydration, dizziness, s/p jejunostomy 01/10/17. Hx of pancreatic cancer-on chemo, dm   Clinical Impression   Patient presents to OT with decreased ADL independence due to the deficits listed below. He will benefit from skilled OT to maximize function and to facilitate a safe discharge. OT will follow.    Follow Up Recommendations  Supervision/Assistance - 24 hour;Home health OT    Equipment Recommendations  Other (comment)(to be determined)    Recommendations for Other Services       Precautions / Restrictions Precautions Precautions: Fall Precaution Comments: feeding tube Restrictions Weight Bearing Restrictions: No      Mobility Bed Mobility             Transfers                 Balance                                         ADL either performed or assessed with clinical judgement   ADL Overall ADL's : Needs assistance/impaired Eating/Feeding: NPO   Grooming: Wash/dry hands;Wash/dry face;Set up   Upper Body Bathing: Minimal assistance;Sitting   Lower Body Bathing: Moderate assistance;Sit to/from Charity fundraiser Details (indicate cue type and reason): pt reports he is getting up to [facility] with nursing staff; did not wish to do this task during evaluation           General ADL Comments: Patient reports fatigue and did not wish to perform many activities during evaluation. Did participate in grooming tasks and simulated bathing tasks. Reports his wife assists with "50%" of bathing, dressing, toileting prior to admission and can assist at discharge. Patient reports they just moved into a new house and he has not seen the bathroom. Will need to follow up with family regarding setup in order to determine if patient has any DME  needs.     Vision         Perception     Praxis      Pertinent Vitals/Pain Pain Assessment: No/denies pain     Hand Dominance Right   Extremity/Trunk Assessment Upper Extremity Assessment Upper Extremity Assessment: Overall WFL for tasks assessed   Lower Extremity Assessment Lower Extremity Assessment: Defer to PT evaluation   Cervical / Trunk Assessment Cervical / Trunk Assessment: Kyphotic   Communication Communication Communication: No difficulties   Cognition Arousal/Alertness: Awake/alert Behavior During Therapy: WFL for tasks assessed/performed Overall Cognitive Status: Within Functional Limits for tasks assessed                                     General Comments       Exercises     Shoulder Instructions      Home Living Family/patient expects to be discharged to:: Private residence Living Arrangements: Spouse/significant other;Other relatives Available Help at Discharge: Family Type of Home: House                       Home Equipment: None   Additional Comments: Patient reports he and his wife just moved into a new house  and he has not seen bathroom so he cannot answer questions about his setup. No family present to ask during eval.      Prior Functioning/Environment Level of Independence: Needs assistance  Gait / Transfers Assistance Needed: independent ADL's / Homemaking Assistance Needed: wife assists "with 50% of tasks" related to bathing, dressing, toileting Communication / Swallowing Assistance Needed: none          OT Problem List: Decreased strength;Decreased activity tolerance;Decreased knowledge of use of DME or AE      OT Treatment/Interventions: Self-care/ADL training;DME and/or AE instruction;Therapeutic activities;Patient/family education    OT Goals(Current goals can be found in the care plan section) Acute Rehab OT Goals Patient Stated Goal: to go home today OT Goal Formulation: With patient Time  For Goal Achievement: 01/26/17 Potential to Achieve Goals: Good ADL Goals Pt Will Perform Upper Body Bathing: with set-up;with supervision;sitting Pt Will Perform Lower Body Bathing: with supervision;sit to/from stand Pt Will Perform Upper Body Dressing: with set-up;with supervision;sitting Pt Will Perform Lower Body Dressing: with supervision;sit to/from stand Pt Will Transfer to Toilet: with supervision;ambulating;bedside commode Pt Will Perform Toileting - Clothing Manipulation and hygiene: with supervision;sit to/from stand  OT Frequency: Min 2X/week   Barriers to D/C:            Co-evaluation              AM-PAC PT "6 Clicks" Daily Activity     Outcome Measure Help from another person eating meals?: Total Help from another person taking care of personal grooming?: A Little Help from another person toileting, which includes using toliet, bedpan, or urinal?: A Little Help from another person bathing (including washing, rinsing, drying)?: A Little Help from another person to put on and taking off regular upper body clothing?: A Little Help from another person to put on and taking off regular lower body clothing?: A Lot 6 Click Score: 15   End of Session    Activity Tolerance: Patient limited by fatigue Patient left: in bed;with call bell/phone within reach;with bed alarm set  OT Visit Diagnosis: Unsteadiness on feet (R26.81);Muscle weakness (generalized) (M62.81)                Time: 5170-0174 OT Time Calculation (min): 10 min Charges:  OT General Charges $OT Visit: 1 Visit OT Evaluation $OT Eval Low Complexity: 1 Low G-Codes:     Cecily Lawhorne A Ecko Beasley Jan 20, 2017, 2:16 PM

## 2017-01-13 ENCOUNTER — Other Ambulatory Visit: Payer: Self-pay | Admitting: Family Medicine

## 2017-01-13 ENCOUNTER — Telehealth: Payer: Self-pay | Admitting: *Deleted

## 2017-01-13 DIAGNOSIS — Z7189 Other specified counseling: Secondary | ICD-10-CM

## 2017-01-13 DIAGNOSIS — Z515 Encounter for palliative care: Secondary | ICD-10-CM

## 2017-01-13 LAB — GLUCOSE, CAPILLARY
GLUCOSE-CAPILLARY: 196 mg/dL — AB (ref 65–99)
GLUCOSE-CAPILLARY: 280 mg/dL — AB (ref 65–99)
GLUCOSE-CAPILLARY: 227 mg/dL — AB (ref 65–99)
Glucose-Capillary: 175 mg/dL — ABNORMAL HIGH (ref 65–99)
Glucose-Capillary: 202 mg/dL — ABNORMAL HIGH (ref 65–99)
Glucose-Capillary: 203 mg/dL — ABNORMAL HIGH (ref 65–99)

## 2017-01-13 MED ORDER — MORPHINE SULFATE (CONCENTRATE) 10 MG/0.5ML PO SOLN: 5.0000 mg | ORAL | Status: DC | PRN

## 2017-01-13 MED ORDER — PRO-STAT SUGAR FREE PO LIQD
30.0000 mL | Freq: Two times a day (BID) | ORAL | Status: DC
  Administered 2017-01-13 – 2017-01-17 (×8): 30 mL
  Filled 2017-01-13 (×4): qty 30

## 2017-01-13 MED ORDER — ONETOUCH ULTRASOFT LANCETS MISC: 12 refills | Status: AC

## 2017-01-13 NOTE — Progress Notes (Signed)
PROGRESS NOTE    Cody Mckay  BSJ:[id number] DOB: 17-Aug-1932 DOA: 01/06/2017 PCP: Dorothyann Peng, NP   Brief Narrative: 83 y.o. 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  Assessment & Plan:  #Severe protein calorie malnutrition, dehydration due to poor intake in the setting of pancreatic malignancy: -Evaluated by oncologist and general surgery.  Status post jejunostomy feeding tube placement by general surgery on 12/10.   -continue advancing tube feeding velocity and continue hydration.  -Patient would continue to be strict n.p.o. per general surgery.   -Follow clinical response -palliative care consulted; looking to go home with [facility] services and follow clinical response; most likely down the road home with hospice.  #Pancreatic cancer, adenocarcinoma in head of pancreas: Unresectable.  There is partial to complete duodenal obstruction secondary to tumor.   -As per oncology, it was reviewed with GI Dr. Ardis Hughs, who doesn't think this is feasible for endoscopic stenting. -Continue supportive care and symptom management. -palliative care consulted, appreciate [facility] discussion. -will continue PRN analgesics and pursuit [place] services. depending how progression or decline would benefit of hospice follow up. -patient to continue follow up with Dr. Burr Medico   #Dizziness likely contributed by orthostatic hypotension and bradycardia: Received IV fluid bolus and hydration with improvement in symptoms.   -CT scan of head with no acute finding.   -Echo unremarkable.   -no abnormalities seen on telemetry; telemetry discontinued  -seen by PT/OT and demonstrated good balance and no complaints with ambulation. Will discharge home with HHPT/HHOT.  #Type 2 diabetes:  -Continue sliding scale.   -Monitor blood sugar level  intermittently -Anticipating some elevation in his blood sugar levels with tube feedings.  #Hypertension:  -Stable and well-controlled -Continue as needed hydralazine for elevated blood pressure. -No antihypertensive regimen has been required prior to admission.  #GERD:  -Continue PPI -Currently IV given strict NPO status -At discharge might be able to use this is a system issue in suspension form to be given through jejunostomy tube with proper flushing.  # Hypokalemia:  -Repleted -Continue intermittently checking electrolytes trend for now. -Receiving IV fluids with some potassium in it. -Patient also on tube feedings now.    #Persistent leukocytosis likely due to demargination, dehydration and malignancy:  -No signs of UTI or pneumonia on workup -Patient is afebrile  -Continue supportive care.   #hyperlipidemia -Will discontinue niacin and Lipitor at this moment -Currently unable to take medications with current available route   DVT prophylaxis: Lovenox sq Code Status: DNR Family Communication: Wife and daughter at bedside Disposition Plan: Remains inpatient; following goals of care discussion with family members plan is to discharge home with home health services and assess patient clinical response.  If he further declines hospice will be pursued.  Will arrange for discharge tomorrow morning.    Consultants:   Oncology  General surgery  Procedures: Feeding tube placement Antimicrobials: None  Subjective: No fever, no nausea, no vomiting, no shortness of breath.  Patient continued reporting intermittent episode of abdominal discomfort.  Still no bowel movements.  Objective: Vitals:   01/12/17 2001 01/13/17 0100 01/13/17 0415 01/13/17 1410  BP: (!) 152/80 127/70 (!) 141/73 (!) 147/68  Pulse: 73 95 75 71  Resp: 20 19 20 20   Temp: 97.7 F (36.5 C) 98.8 F (37.1 C) 97.9 F (36.6 C) 98.1 F (36.7 C)  TempSrc: Oral Oral Oral Oral  SpO2: 97% 98% 99% 98%    Weight:   70.9 kg (156 lb 4.9 oz)   Height:        Intake/Output Summary (Last 24 hours) at 01/13/2017 1549 Last data filed at 01/13/2017 0920 Gross per 24 hour  Intake 1499.25 ml  Output 900 ml  Net 599.25 ml   Filed Weights   01/11/17 1333 01/12/17 0436 01/13/17 0415  Weight: 75.5 kg (166 lb 7.2 oz) 72.9 kg (160 lb 11.5 oz) 70.9 kg (156 lb 4.9 oz)    Examination: General exam: Afebrile, cachectic, chronically ill and frail on exam; no chest pain, no nausea, no vomiting.  Still no bowel movement.  Reports some intermittent abdominal pain. Respiratory system: Good air movement bilaterally, no wheezing, no crackles; good oxygen saturation on room air and with normal respiratory effort. Cardiovascular system: RRR, positive systolic ejection murmur, no rubs, no gallops, no JVD. Gastrointestinal system: Soft, mild tenderness to palpation, no guarding, positive bowel sounds; jejunostomy tube in place without any surrounding erythema or drainage. Central nervous system: Alert, awake and oriented x3; cranial nerve grossly intact; no focal deficit. Skin: No rashes, no petechiae, no open ulcers; jejunostomy tube in place.   Data Reviewed: I have personally reviewed following labs and imaging studies  CBC: Recent Labs  Lab 01/06/17 1739  01/07/17 1040 01/09/17 0447 01/10/17 0356 01/11/17 0510 01/12/17 0400  WBC 14.9*  --  14.4* 16.1* 17.6* 18.4* 17.6*  NEUTROABS 11.3*  --   --   --   --   --   --   HGB 9.8*   < > 9.4* 10.7* 10.3* 9.7* 10.2*  HCT 29.0*   < > 28.8* 32.8* 31.3* 29.8* 31.1*  MCV 89.5  --  90.0 90.4 89.9 90.9 90.4  PLT 274  --  304 374 366 327 318   < > = values in this interval not displayed.   Basic Metabolic Panel: Recent Labs  Lab 01/06/17 1739  01/07/17 1040 01/09/17 0447 01/10/17 0356 01/11/17 0510 01/12/17 0400  NA 136   < > 139 138 139 138 136  K 3.4*   < > 3.4* 3.4* 3.6 3.3* 3.2*  CL 108   < > 110 104 107 105 102  CO2 22  --  22 23 24 28 29   GLUCOSE  60*   < > 85 130* 179* 186* 152*  BUN 19   < > 13 7 8 7 6   CREATININE 0.71   < > 0.68 0.58* 0.61 0.61 0.52*  CALCIUM 8.2*  --  8.2* 8.2* 8.0* 7.9* 7.7*  MG 1.8  --   --  1.4* 1.7  --   --    < > = values in this interval not displayed.   GFR: Estimated Creatinine Clearance: 71.4 mL/min (A) (by C-G formula based on SCr of 0.52 mg/dL (L)).   Liver Function Tests: Recent Labs  Lab 01/06/17 1739 01/07/17 1040  AST 70* 52*  ALT 43 40  ALKPHOS 67 73  BILITOT 0.8 0.6  PROT 5.3* 5.3*  ALBUMIN 2.3* 2.3*   Recent Labs  Lab 01/06/17 1739  LIPASE 35   Coagulation Profile: Recent Labs  Lab 01/07/17 1040  INR 1.41   Cardiac Enzymes: Recent Labs  Lab 01/07/17 2359  TROPONINI <0.03   CBG: Recent Labs  Lab 01/12/17 1958 01/13/17 0110 01/13/17 0418 01/13/17 0808 01/13/17 1126  GLUCAP 164* 175* 196* 202* 203*    Recent Results (from the past 240 hour(s))  Culture, Blood  Status: None   Collection Time: 01/04/17 11:57 AM  Result Value Ref Range Status   BLOOD CULTURE, ROUTINE Final report  Final   Organism ID, Bacteria Comment  Final    Comment: No aerobic or anaerobic growth in five days.  Culture, Blood     Status: None   Collection Time: 01/04/17 11:57 AM  Result Value Ref Range Status   BLOOD CULTURE, ROUTINE Final report  Final   Organism ID, Bacteria Comment  Final    Comment: No aerobic or anaerobic growth in five days.  TECHNOLOGIST REVIEW     Status: None   Collection Time: 01/05/17 10:16 AM  Result Value Ref Range Status   Technologist Review Rare meta, few ovalos  Final  Stool culture (children & immunocomp patients)     Status: None   Collection Time: 01/05/17 11:15 AM  Result Value Ref Range Status   Salmonella/Shigella Screen Final report  Final   Campylobacter Culture Final report  Final   E coli, Shiga toxin Assay Negative Negative Final    Comment: (NOTE) Performed At: [facility] [place], [place] [id number] Rush Farmer MD NO:[id number]   STOOL CULTURE REFLEX - RSASHR     Status: None   Collection Time: 01/05/17 11:15 AM  Result Value Ref Range Status   Stool Culture result 1 (RSASHR) Comment  Final    Comment: (NOTE) No Salmonella or Shigella recovered. Performed At: [facility] [address] [place], [place] [id number] Rush Farmer MD UT:[id number]   STOOL CULTURE Reflex - CMPCXR     Status: None   Collection Time: 01/05/17 11:15 AM  Result Value Ref Range Status   Stool Culture result 1 (CMPCXR) Comment  Final    Comment: (NOTE) No Campylobacter species isolated. Performed At: [facility] [place], [place] [id number] Rush Farmer MD GY:[id number]   Surgical pcr screen     Status: None   Collection Time: 01/09/17 12:26 PM  Result Value Ref Range Status   MRSA, PCR NEGATIVE NEGATIVE Final   Staphylococcus aureus NEGATIVE NEGATIVE Final    Comment: (NOTE) The Xpert SA Assay (FDA approved for NASAL specimens in patients 41 years of age and older), is one component of a comprehensive surveillance program. It is not intended to diagnose infection nor to guide or monitor treatment.      Scheduled Meds: . enoxaparin (LOVENOX) injection  40 mg Subcutaneous Q24H  . feeding supplement (PRO-STAT SUGAR FREE 64)  30 mL Per Tube BID  . insulin aspart  0-15 Units Subcutaneous Q4H  . pantoprazole (PROTONIX) IV  40 mg Intravenous Q24H   Continuous Infusions: . dextrose 5 % and 0.45 % NaCl with KCl 20 mEq/L 50 mL/hr at 01/13/17 1501  . feeding supplement (OSMOLITE 1.5 CAL) 1,000 mL (01/13/17 1130)  . lactated ringers 10 mL/hr at 01/10/17 1014     LOS: 7 days    Barton Dubois, MD [facility] Pager [phone]  If 7PM-7AM, please contact night-coverage [email or web address] Password [facility] 01/13/2017, 3:49 PM

## 2017-01-13 NOTE — Progress Notes (Signed)
[facility] Progress Note  3 Days Post-Op  Subjective: CC:  Resting comfortably. No new complaints tolerating TF at 40 cc/hr.   Objective: Vital signs in last 24 hours: Temp:  [97.6 F (36.4 C)-98.8 F (37.1 C)] 97.9 F (36.6 C) (12/13 0415) Pulse Rate:  [73-95] 75 (12/13 0415) Resp:  [18-20] 20 (12/13 0415) BP: (127-152)/(68-80) 141/73 (12/13 0415) SpO2:  [94 %-99 %] 99 % (12/13 0415) Weight:  [70.9 kg (156 lb 4.9 oz)] 70.9 kg (156 lb 4.9 oz) (12/13 0415) Last BM Date: 01/10/17  Intake/Output from previous day: 12/12 0701 - 12/13 0700 In: 3139.3 [I.V.:2628.8; NG/GT:510.5] Out: 1100 [Urine:1100] Intake/Output this shift: No intake/output data recorded.  PE: General appearance:alert, cooperative and no distress TM:LYYT, non-tender; bowel sounds normal; no masses, no organomegaly and Jejunostomy site is clean and dry.  Lab Results:  Recent Labs    01/11/17 0510 01/12/17 0400  WBC 18.4* 17.6*  HGB 9.7* 10.2*  HCT 29.8* 31.1*  PLT 327 318   BMET Recent Labs    01/11/17 0510 01/12/17 0400  NA 138 136  K 3.3* 3.2*  CL 105 102  CO2 28 29  GLUCOSE 186* 152*  BUN 7 6  CREATININE 0.61 0.52*  CALCIUM 7.9* 7.7*   PT/INR No results for input(s): LABPROT, INR in the last 72 hours. CMP     Component Value Date/Time   NA 136 01/12/2017 0400   NA 140 01/05/2017 1016   K 3.2 (L) 01/12/2017 0400   K 3.4 (L) 01/05/2017 1016   CL 102 01/12/2017 0400   CO2 29 01/12/2017 0400   CO2 20 (L) 01/05/2017 1016   GLUCOSE 152 (H) 01/12/2017 0400   GLUCOSE 94 01/05/2017 1016   BUN 6 01/12/2017 0400   BUN 28.0 (H) 01/05/2017 1016   CREATININE 0.52 (L) 01/12/2017 0400   CREATININE 0.9 01/05/2017 1016   CALCIUM 7.7 (L) 01/12/2017 0400   CALCIUM 8.9 01/05/2017 1016   PROT 5.3 (L) 01/07/2017 1040   PROT 5.7 (L) 01/05/2017 1016   ALBUMIN 2.3 (L) 01/07/2017 1040   ALBUMIN 2.4 (L) 01/05/2017 1016   AST 52 (H) 01/07/2017 1040   AST 54 (H) 01/05/2017 1016   ALT 40 01/07/2017 1040   ALT 28 01/05/2017 1016   ALKPHOS 73 01/07/2017 1040   ALKPHOS 86 01/05/2017 1016   BILITOT 0.6 01/07/2017 1040   BILITOT 0.74 01/05/2017 1016   GFRNONAA >60 01/12/2017 0400   GFRAA >60 01/12/2017 0400   Lipase     Component Value Date/Time   LIPASE 35 01/06/2017 1739   Studies/Results: No results found.  Anti-infectives: Anti-infectives (From admission, onward)   Start     Dose/Rate Route Frequency Ordered Stop   01/10/17 2000  ceFAZolin (ANCEF) IVPB 2g/100 mL premix     2 g 200 mL/hr over 30 Minutes Intravenous Every 8 hours 01/10/17 1451 01/10/17 2042   01/10/17 1042  ceFAZolin (ANCEF) 2-4 GM/100ML-% IVPB    Comments:  [name], [name]  : cabinet override      01/10/17 1042 01/10/17 2259   01/10/17 0600  ceFAZolin (ANCEF) powder 1 g  Status:  Discontinued     1 g Other To Surgery 01/09/17 1007 01/10/17 1046     Assessment/Plan Unresectable pancreatic adenocarcinoma, head of pancreascT2N1M0,stage IIB, Partial duodenal obstruction Severe dehydration and malnutrition  Type 2 diabetes Hypertension GERD DNR/DNI  FEN:IV fluids;TF @ 40 cc/hr. Ok to advance to goal of 88 cc/24h as patient toleates. DVT:SCD KP:TWSFK Ancef Follow-up:Dr. Hassell Done as  needed  Plan: NPO. Advance TF as tolerated. Liquid meds ok per jejunostomy tube, no crushed meds/pills. Per primary team, palliative to see patient today.    LOS: 7 days    Jill Alexanders , [facility] 01/13/2017, 10:34 AM Pager: [phone] Consults: [phone] Mon-Fri 7:00 am-4:30 pm Sat-Sun 7:00 am-11:30 am

## 2017-01-13 NOTE — Telephone Encounter (Signed)
Received a fax from the pharmacy.  Cody Mckay needed testing frequency and dx code in order to bill Medicare part B.  Rx re sent to the pharmacy by e-scribe.

## 2017-01-13 NOTE — Telephone Encounter (Signed)
I called Malachy Mood back and spoke with her. I plan to see pt in hospital also today.   Truitt Merle MD

## 2017-01-13 NOTE — Consult Note (Signed)
Consultation Note Date: 01/13/2017   Patient Name: Cody Mckay  DOB: September 16, 1932  MRN: [id number]  Age / Sex: 83 y.o., male  PCP: Dorothyann Peng, NP Referring Physician: Barton Dubois, MD  Reason for Consultation: Establishing goals of care  HPI/Patient Profile: 83 y.o. male  admitted on 01/06/2017  medical history significant for diabetes hypertension dyslipidemia and gastroesophageal reflux disease. Patient has also been diagnosed with unresectable adenocarcinoma of the pancreas and was recently on chemotherapy. Patient is originally from [place], has recently relocated to [place], [place] and is currently living with his daughter and son-in-law along with his wife. Patient is noted to have partial to complete duodenal obstruction, deemed not an overt to stenting. Hence, the patient underwent G-tube placement and has been started on tube feeds. At present patient is tolerating tube feeds. He has been seen by physical therapy and is evaluated to be appropriate for home health physical therapy. Patient has been receiving IV morphine for abdominal discomfort. Serum albumin is 2.3 g/dL. He has established care with a local oncologist Dr. Burr Medico.  A palliative consultation has been requested for appropriate pain and non-pain symptom management and goals of care discussions.  Elderly gentleman resting comfortably in bed awakens easily, answers questions appropriately. I introduced myself and palliative care as follows: Palliative medicine is specialized medical care for people living with serious illness. It focuses on providing relief from the symptoms and stress of a serious illness. The goal is to improve quality of life for both the patient and the family.  Discussed with patient. Call placed and discussed with wife over the phone. Goals wishes and values discussed. Patient and family remain hopeful  that his reserves will improve, his nutritional status will improve such that he will be able to resume chemotherapy. They understand he has serious life limiting illness. We discussed about appropriate symptom management. Will add morphine solution to be given via the tube. Monitor tube feeds, monitor pain medication needs. See recommendations below. Thank you for the consult.   NEXT OF KIN  wife, he is from [place], recently moved to [place] to be closer to family- his daughter and son in law who live locally in [place] with DNR To continue with J tube feeds  Add low dose Morphine through tube for pain relief prn use.  Continue current mode of care PT assessment notes likely home with home PT on discharge. Patient has established with Dr Burr Medico from oncology.   Code Status/Advance Care Planning:  DNR    Symptom Management:    Add low dose Morphine solution, to be given through the tube, for pain, the patient has a codeine allergy listed, how ever, has tolerated IV Morphine. Continue to monitor for pain relief.   Palliative Prophylaxis:   Bowel Regimen   Psycho-social/Spiritual:   Desire for further Chaplaincy support:no  Additional Recommendations: Caregiving  Support/Resources  Prognosis:   Unable to determine  Discharge Planning: Home with Home Health  Primary Diagnoses: Present on Admission: . Dehydration   I have reviewed the medical record, interviewed the patient and family, and examined the patient. The following aspects are pertinent.  Past Medical History:  Diagnosis Date  . Chicken pox   . Diabetes mellitus without complication ([place])   . Diverticulitis   . Gastric ulcer   . Hyperlipidemia   . Hypertension    Social History   Socioeconomic History  . Marital status: Married    Spouse name: None  . Number of children: None  . Years of education: None  . Highest education level: None  Social Needs   . Financial resource strain: None  . Food insecurity - worry: None  . Food insecurity - inability: None  . Transportation needs - medical: None  . Transportation needs - non-medical: None  Occupational History  . None  Tobacco Use  . Smoking status: Never Smoker  . Smokeless tobacco: Never Used  Substance and Sexual Activity  . Alcohol use: No    Frequency: Never  . Drug use: No  . Sexual activity: None  Other Topics Concern  . None  Social History Narrative  . None   Family History  Problem Relation Age of Onset  . Cancer Father        lung cancer  . Cancer Son        testicular cancer    Scheduled Meds: . enoxaparin (LOVENOX) injection  40 mg Subcutaneous Q24H  . feeding supplement (PRO-STAT SUGAR FREE 64)  30 mL Per Tube BID  . insulin aspart  0-15 Units Subcutaneous Q4H  . pantoprazole (PROTONIX) IV  40 mg Intravenous Q24H   Continuous Infusions: . dextrose 5 % and 0.45 % NaCl with KCl 20 mEq/L 50 mL/hr at 01/13/17 1200  . feeding supplement (OSMOLITE 1.5 CAL) 1,000 mL (01/13/17 0503)  . lactated ringers 10 mL/hr at 01/10/17 1014   PRN Meds:.acetaminophen (TYLENOL) oral liquid 160 mg/5 mL, hydrALAZINE, HYDROcodone-acetaminophen, morphine injection, morphine CONCENTRATE, ondansetron (ZOFRAN) IV, ondansetron **OR** ondansetron (ZOFRAN) IV, sodium chloride flush, Trazodone Compounded Suspension 10mg /mL Medications Prior to Admission:  Prior to Admission medications   Medication Sig Start Date End Date Taking? Authorizing Provider  acetaminophen (TYLENOL) 500 MG tablet Take 500 mg by mouth every 6 (six) hours as needed.   Yes [provider]  alfuzosin (UROXATRAL) 10 MG 24 hr tablet  11/18/16  Yes [provider]  atorvastatin (LIPITOR) 10 MG tablet Take 5 mg by mouth daily at 6 PM.  11/24/16  Yes [provider]  Calcium-Magnesium-Vitamin D (CALCIUM 500 PO) Take 1 tablet by mouth daily.   Yes [provider]  diphenoxylate-atropine  (LOMOTIL) 2.5-0.025 MG tablet  12/17/16  Yes [provider]  esomeprazole (NEXIUM) 40 MG capsule Take 2 capsules by mouth daily. 11/29/16  Yes [provider]  insulin aspart (NOVOLOG) 100 UNIT/ML injection Inject 5 Units into the skin 3 (three) times daily before meals.   Yes [provider]  LANTUS SOLOSTAR 100 UNIT/ML Solostar Pen Inject 15 Units into the skin daily. 11/29/16  Yes [provider]  NIASPAN 750 MG CR tablet Take 750 mg by mouth 2 (two) times daily.  10/06/16  Yes [provider]  ondansetron (ZOFRAN) 8 MG tablet  12/03/16  Yes [provider]  potassium chloride SA (K-DUR,KLOR-CON) 20 MEQ tablet Take 60 mEq by mouth daily.  12/14/16  Yes [provider]  prochlorperazine (COMPAZINE) 10 MG tablet as needed. 12/03/16  Yes [provider]  pyridOXINE (VITAMIN B-6) 100 MG tablet Take 100 mg by mouth daily.   Yes [provider]  triamcinolone ointment (KENALOG) 0.1 % as needed. 11/22/16  Yes [provider]  amLODipine (NORVASC) 5 MG tablet  10/21/16   [provider]  glucose blood (ONETOUCH VERIO) test strip Use as instructed 01/04/17   Nafziger, Tommi Rumps, NP  Insulin Syringe-Needle U-100 (INSULIN SYRINGE 1CC/30GX5/16") 30G X 5/16" 1 ML MISC . 01/04/17   Nafziger, Tommi Rumps, NP  Lancets [facility] ULTRASOFT) lancets Use as instructed 01/12/17   Dorothyann Peng, NP  lidocaine-prilocaine (EMLA) cream Apply 1 application topically as needed. 12/22/16   Truitt Merle, MD   Allergies  Allergen Reactions  . Codeine Other (See Comments)    Schizophrenic reactions.   Review of Systems +generalized pain, not currently.   Physical Exam Awakens easily Answers questions appropriately S1 S2 Abdomen soft, has J tube Thin extremities Dry skin No edema  Vital Signs: BP (!) 141/73 (BP Location: Right Arm)   Pulse 75   Temp 97.9 F (36.6 C) (Oral)   Resp 20   Ht 5\' 10"  (1.778 m)   Wt 70.9 kg (156 lb 4.9  oz)   SpO2 99%   BMI 22.43 kg/m  Pain Assessment: No/denies pain   Pain Score: 5    SpO2: SpO2: 99 % O2 Device:SpO2: 99 % O2 Flow Rate: .O2 Flow Rate (L/min): 2 L/min  IO: Intake/output summary:   Intake/Output Summary (Last 24 hours) at 01/13/2017 1320 Last data filed at 01/13/2017 0920 Gross per 24 hour  Intake 1879.25 ml  Output 900 ml  Net 979.25 ml    LBM: Last BM Date: 01/10/17 Baseline Weight: Weight: 72 kg (158 lb 11.7 oz) Most recent weight: Weight: 70.9 kg (156 lb 4.9 oz)     Palliative Assessment/Data:   Flowsheet Rows     Most Recent Value  Intake Tab  Referral Department  Hospitalist  Unit at Time of Referral  Oncology Unit  Palliative Care Primary Diagnosis  Cancer  Palliative Care Type  New Palliative care  Reason for referral  Clarify Goals of Care  Date first seen by Palliative Care  01/13/17  Clinical Assessment  Palliative Performance Scale Score  30%  Pain Max last 24 hours  4  Pain Min Last 24 hours  3  Dyspnea Max Last 24 Hours  3  Dyspnea Min Last 24 hours  2  Psychosocial & Spiritual Assessment  Palliative Care Outcomes    PPS 40%  Time In: 11  Time Out: 12.10  Time Total:  70 min  Greater than 50%  of this time was spent counseling and coordinating care related to the above assessment and plan.  Signed by: Loistine Chance, MD  [phone]  Please contact Palliative Medicine Team phone at [phone] for questions and concerns.  For individual provider: See Shea Evans

## 2017-01-13 NOTE — Telephone Encounter (Signed)
Daughter Charline Bills called re:  Pt is currently in the hospital, and awaiting Hospice consult - whether going home with hospice or going to [facility].  Malachy Mood did not think appts with Dr. Burr Medico will be necessary. Cheryl's    Phone    [phone].

## 2017-01-14 ENCOUNTER — Other Ambulatory Visit: Payer: Medicare Other

## 2017-01-14 ENCOUNTER — Ambulatory Visit: Payer: Medicare Other

## 2017-01-14 ENCOUNTER — Telehealth: Payer: Self-pay | Admitting: Hematology

## 2017-01-14 ENCOUNTER — Ambulatory Visit: Payer: Medicare Other | Admitting: Nurse Practitioner

## 2017-01-14 DIAGNOSIS — R197 Diarrhea, unspecified: Secondary | ICD-10-CM

## 2017-01-14 LAB — GLUCOSE, CAPILLARY
GLUCOSE-CAPILLARY: 228 mg/dL — AB (ref 65–99)
GLUCOSE-CAPILLARY: 268 mg/dL — AB (ref 65–99)
GLUCOSE-CAPILLARY: 113 mg/dL — AB (ref 65–99)
Glucose-Capillary: 193 mg/dL — ABNORMAL HIGH (ref 65–99)
Glucose-Capillary: 194 mg/dL — ABNORMAL HIGH (ref 65–99)
Glucose-Capillary: 264 mg/dL — ABNORMAL HIGH (ref 65–99)

## 2017-01-14 MED ORDER — INSULIN GLARGINE 100 UNIT/ML ~~LOC~~ SOLN
5.0000 [IU] | Freq: Every day | SUBCUTANEOUS | Status: DC
  Administered 2017-01-14 – 2017-01-16 (×3): 5 [IU] via SUBCUTANEOUS
  Filled 2017-01-14 (×5): qty 0.05

## 2017-01-14 NOTE — Progress Notes (Signed)
PROGRESS NOTE    Cody Mckay  WCH:[id number] DOB: [birth year] DOA: 01/06/2017 PCP: Dorothyann Peng, NP   Brief Narrative: 83 y.o. 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  Assessment & Plan:  #Severe protein calorie malnutrition, dehydration due to poor intake in the setting of pancreatic malignancy: -Evaluated by oncologist and general surgery.  Status post jejunostomy feeding tube placement by general surgery on 12/10.   -continue advancing tube feeding velocity and continue hydration.  -Patient would continue to be strict n.p.o. per general surgery.   -Follow clinical response -palliative care consulted; looking to go home with [facility] services and follow clinical response; most likely down the road home with hospice.  #Pancreatic cancer, adenocarcinoma in head of pancreas: Unresectable.  There is partial to complete duodenal obstruction secondary to tumor.   -As per oncology, it was reviewed with GI Dr. Ardis Hughs, who doesn't think this is feasible for endoscopic stenting. -Continue supportive care and symptom management. -palliative care consulted, appreciate [place] discussion. -will continue PRN analgesics and pursuit [place] services. depending how progression or decline would benefit of hospice follow up. -patient to continue follow up with Dr. Burr Medico (next appointment 12/20 eighth/18).  #Dizziness likely contributed by orthostatic hypotension and bradycardia: Received IV fluid bolus and hydration with improvement in symptoms.   -CT scan of head with no acute finding.   -Echo unremarkable.   -no abnormalities seen on telemetry; telemetry discontinued  -seen by PT/OT and demonstrated good balance and no complaints with ambulation. Will discharge home with HHPT/HHOT.  #Type 2 diabetes:  -Continue sliding  scale.   -Monitor blood sugar level intermittently -Anticipating some elevation in his blood sugar levels with tube feedings. -will add low dose lantus.  #Hypertension:  -Stable and well-controlled -Continue as needed hydralazine for elevated blood pressure. -No antihypertensive regimen has been required prior to admission.  #GERD:  -Continue PPI -Currently IV given strict NPO status -At discharge might be able to use this is a system issue in suspension form to be given through jejunostomy tube with proper flushing.  # Hypokalemia:  -Repleted -Continue intermittently checking electrolytes trend for now. -Receiving IV fluids with some potassium in it. -Patient also on tube feedings now.    #Persistent leukocytosis likely due to demargination, dehydration and malignancy:  -No signs of UTI or pneumonia on workup -Patient is afebrile  -Continue supportive care.   #hyperlipidemia -Will discontinue niacin and Lipitor at this moment -Currently unable to take medications with current available route   DVT prophylaxis: Lovenox sq Code Status: DNR Family Communication: Wife and daughter at bedside Disposition Plan: Remains inpatient; following goals of care discussion with family members plan is to discharge home with home health services and assess patient clinical response.  If he further declines hospice will be pursued.  Will arrange for discharge tomorrow morning.    Consultants:   Oncology  General surgery  Procedures: Feeding tube placement Antimicrobials: None  Subjective: No fever, no vomiting, no significant abdominal discomfort.  Patient now having bowel movements and reports some nausea.  Objective: Vitals:   01/13/17 1410 01/13/17 2100 01/14/17 0403 01/14/17 0514  BP: (!) 147/68 (!) 143/67 (!) 148/70   Pulse: 71 78 87   Resp: 20 20 20    Temp: 98.1 F (36.7 C) 98.5 F (36.9 C) (!) 100.6 F (38.1 C) 98.8 F (37.1 C)  TempSrc: Oral  Oral Oral   SpO2: 98% 97%  94%   Weight:    76.5 kg (168 lb 10.4 oz)  Height:        Intake/Output Summary (Last 24 hours) at 01/14/2017 1240 Last data filed at 01/14/2017 0600 Gross per 24 hour  Intake 1303 ml  Output -  Net 1303 ml   Filed Weights   01/12/17 0436 01/13/17 0415 01/14/17 0514  Weight: 72.9 kg (160 lb 11.5 oz) 70.9 kg (156 lb 4.9 oz) 76.5 kg (168 lb 10.4 oz)    Examination: General exam: Afebrile, cachectic, chronically ill and frail on exam; no chest pain, no nausea, no vomiting.  Having bowel movements now.  Reports on nausea.  Respiratory system: Good air movement bilaterally, no wheezing, no crackles; good oxygen saturation on room air and with normal respiratory effort.  Cardiovascular system: RRR, positive soft systolic ejection murmur, no rubs, no gallops, no JVD.   Gastrointestinal system: Soft, mild tenderness to palpation, no guarding, positive bowel sounds; jejunostomy tube in place without any surrounding erythema or drainage. Central nervous system: Alert, awake and oriented x3; cranial nerve grossly intact; no focal deficit. Skin: No rashes, no petechiae, no open ulcers; jejunostomy tube in place.   Data Reviewed: I have personally reviewed following labs and imaging studies  CBC: Recent Labs  Lab 01/09/17 0447 01/10/17 0356 01/11/17 0510 01/12/17 0400  WBC 16.1* 17.6* 18.4* 17.6*  HGB 10.7* 10.3* 9.7* 10.2*  HCT 32.8* 31.3* 29.8* 31.1*  MCV 90.4 89.9 90.9 90.4  PLT 374 366 327 784   Basic Metabolic Panel: Recent Labs  Lab 01/09/17 0447 01/10/17 0356 01/11/17 0510 01/12/17 0400  NA 138 139 138 136  K 3.4* 3.6 3.3* 3.2*  CL 104 107 105 102  CO2 23 24 28 29   GLUCOSE 130* 179* 186* 152*  BUN 7 8 7 6   CREATININE 0.58* 0.61 0.61 0.52*  CALCIUM 8.2* 8.0* 7.9* 7.7*  MG 1.4* 1.7  --   --    GFR: Estimated Creatinine Clearance: 73.5 mL/min (A) (by C-G formula based on SCr of 0.52 mg/dL (L)).   Cardiac Enzymes: Recent Labs  Lab 01/07/17 2359  TROPONINI <0.03     CBG: Recent Labs  Lab 01/13/17 2104 01/14/17 0028 01/14/17 0355 01/14/17 0759 01/14/17 1151  GLUCAP 227* 193* 228* 194* 264*    Recent Results (from the past 240 hour(s))  TECHNOLOGIST REVIEW     Status: None   Collection Time: 01/05/17 10:16 AM  Result Value Ref Range Status   Technologist Review Rare meta, few ovalos  Final  Stool culture (children & immunocomp patients)     Status: None   Collection Time: 01/05/17 11:15 AM  Result Value Ref Range Status   Salmonella/Shigella Screen Final report  Final   Campylobacter Culture Final report  Final   E coli, Shiga toxin Assay Negative Negative Final    Comment: (NOTE) Performed At: [facility] [place], [place] [id number] Rush Farmer MD [id number]   STOOL CULTURE REFLEX - RSASHR     Status: None   Collection Time: 01/05/17 11:15 AM  Result Value Ref Range Status   Stool Culture result 1 (RSASHR) Comment  Final    Comment: (NOTE) No Salmonella or Shigella recovered. Performed At: [facility] [address] [place], [place] [id number] Rush Farmer MD QQ:[id number]   STOOL CULTURE Reflex - CMPCXR     Status: None   Collection Time: 01/05/17 11:15 AM  Result Value Ref Range Status  Stool Culture result 1 (CMPCXR) Comment  Final    Comment: (NOTE) No Campylobacter species isolated. Performed At: [facility] [place], [place] [id number] Rush Farmer MD RP:[id number]   Surgical pcr screen     Status: None   Collection Time: 01/09/17 12:26 PM  Result Value Ref Range Status   MRSA, PCR NEGATIVE NEGATIVE Final   Staphylococcus aureus NEGATIVE NEGATIVE Final    Comment: (NOTE) The Xpert SA Assay (FDA approved for NASAL specimens in patients 35 years of age and older), is one component of a comprehensive surveillance program. It is not intended to diagnose infection nor to guide or monitor treatment.      Scheduled Meds: . enoxaparin (LOVENOX)  injection  40 mg Subcutaneous Q24H  . feeding supplement (PRO-STAT SUGAR FREE 64)  30 mL Per Tube BID  . insulin aspart  0-15 Units Subcutaneous Q4H  . pantoprazole (PROTONIX) IV  40 mg Intravenous Q24H   Continuous Infusions: . feeding supplement (OSMOLITE 1.5 CAL) 1,000 mL (01/13/17 2147)  . lactated ringers 10 mL/hr at 01/10/17 1014     LOS: 8 days    Barton Dubois, MD [facility] Pager [phone]  If 7PM-7AM, please contact night-coverage [email or web address] Password [email or web address] 01/14/2017, 12:40 PM

## 2017-01-14 NOTE — Telephone Encounter (Signed)
Mailed calendar of upcoming appointments to patient per 12/14 sch message.

## 2017-01-14 NOTE — Progress Notes (Signed)
[id number] Julie-Anne Torain,BSN,RN3,CCM: Spoke with the daughter Malachy Mood will need all of the services that they can get.  In the process of moving parent in with them while house is being renovated. Explain that snf and private duty servide will be slef pay if she feels like she can not handle patient at the home.  Did explain that dme can help with him not going up step to the bathroom, and that hhc will teach them how to do the feeding tube and other physical needs.  Daughter is very frustrated and does not know what she wants to do at this point.

## 2017-01-14 NOTE — Progress Notes (Signed)
[facility] Progress Note  4 Days Post-Op  Subjective: CC:  Denies abdominal pain or bloating. Denies nausea/emesis since prior to surgery. Tolerating TF at goal rate. Reports multiple loose stools daily.   Low grade temp noted -100.6 Objective: Vital signs in last 24 hours: Temp:  [98.1 F (36.7 C)-100.6 F (38.1 C)] 98.8 F (37.1 C) (12/14 0514) Pulse Rate:  [71-87] 87 (12/14 0403) Resp:  [20] 20 (12/14 0403) BP: (143-148)/(67-70) 148/70 (12/14 0403) SpO2:  [94 %-98 %] 94 % (12/14 0403) Weight:  [76.5 kg (168 lb 10.4 oz)] 76.5 kg (168 lb 10.4 oz) (12/14 0514) Last BM Date: 01/10/17  Intake/Output from previous day: 12/13 0701 - 12/14 0700 In: 1303 [NG/GT:1303] Out: -  Intake/Output this shift: No intake/output data recorded.  PE: General appearance:somnolent, cooperative and no distress UX:LKGM, non-tender; bowel sounds normal; no masses, no organomegaly and Jejunostomy site is clean and dry.  Lab Results:  Recent Labs    01/12/17 0400  WBC 17.6*  HGB 10.2*  HCT 31.1*  PLT 318   BMET Recent Labs    01/12/17 0400  NA 136  K 3.2*  CL 102  CO2 29  GLUCOSE 152*  BUN 6  CREATININE 0.52*  CALCIUM 7.7*   PT/INR No results for input(s): LABPROT, INR in the last 72 hours. CMP     Component Value Date/Time   NA 136 01/12/2017 0400   NA 140 01/05/2017 1016   K 3.2 (L) 01/12/2017 0400   K 3.4 (L) 01/05/2017 1016   CL 102 01/12/2017 0400   CO2 29 01/12/2017 0400   CO2 20 (L) 01/05/2017 1016   GLUCOSE 152 (H) 01/12/2017 0400   GLUCOSE 94 01/05/2017 1016   BUN 6 01/12/2017 0400   BUN 28.0 (H) 01/05/2017 1016   CREATININE 0.52 (L) 01/12/2017 0400   CREATININE 0.9 01/05/2017 1016   CALCIUM 7.7 (L) 01/12/2017 0400   CALCIUM 8.9 01/05/2017 1016   PROT 5.3 (L) 01/07/2017 1040   PROT 5.7 (L) 01/05/2017 1016   ALBUMIN 2.3 (L) 01/07/2017 1040   ALBUMIN 2.4 (L) 01/05/2017 1016   AST 52 (H) 01/07/2017 1040   AST 54 (H) 01/05/2017 1016   ALT 40  01/07/2017 1040   ALT 28 01/05/2017 1016   ALKPHOS 73 01/07/2017 1040   ALKPHOS 86 01/05/2017 1016   BILITOT 0.6 01/07/2017 1040   BILITOT 0.74 01/05/2017 1016   GFRNONAA >60 01/12/2017 0400   GFRAA >60 01/12/2017 0400   Lipase     Component Value Date/Time   LIPASE 35 01/06/2017 1739       Studies/Results: No results found.  Anti-infectives: Anti-infectives (From admission, onward)   Start     Dose/Rate Route Frequency Ordered Stop   01/10/17 2000  ceFAZolin (ANCEF) IVPB 2g/100 mL premix     2 g 200 mL/hr over 30 Minutes Intravenous Every 8 hours 01/10/17 1451 01/10/17 2042   01/10/17 1042  ceFAZolin (ANCEF) 2-4 GM/100ML-% IVPB    Comments:  [name], [name]  : cabinet override      01/10/17 1042 01/10/17 2259   01/10/17 0600  ceFAZolin (ANCEF) powder 1 g  Status:  Discontinued     1 g Other To Surgery 01/09/17 1007 01/10/17 1046       Assessment/Plan Unresectable pancreaticadenocarcinoma, head of pancreascT2N1M0,stage IIB, Partial duodenal obstruction Severe dehydration and malnutrition POD#4 S/P Lap assisted feeding jejunostomy 12/10 Dr. Johnathan Hausen    Type 2 diabetes Hypertension GERD DNR/DNI  FEN:NPO; IV fluids;TF @ 60  cc/hr. Liquid meds per J-tube - NO crushed meds/pills. DVT:SCD NO:IBBCW Ancef Follow-up:Dr. Hassell Done as needed  Plan: tolerating TF at 60 cc/hr. General surgery will sign off. Follow up with Dr. Hassell Done PRN.    LOS: 8 days    Jill Alexanders , [facility] 01/14/2017, 8:26 AM Pager: [phone] Consults: [phone] Mon-Fri 7:00 am-4:30 pm Sat-Sun 7:00 am-11:30 am

## 2017-01-14 NOTE — Progress Notes (Signed)
Daily Progress Note   Patient Name: Cody Mckay       Date: 01/14/2017 DOB: 06-Apr-1932  Age: 83 y.o. MRN#: [id number] Attending Physician: Barton Dubois, MD Primary Care Physician: Dorothyann Peng, NP Admit Date: 01/06/2017  Reason for Consultation/Follow-up: Establishing goals of care  Subjective: Awake alert resting in chair He is able to move from bed to chair with some support and assistance from me Wife at bedside Patient recalls meeting with Dr Burr Medico this am Discussed with patient and wife, discussed with daughter Malachy Mood over the phone. Malachy Mood is an Therapist, sports, she is a former ED Production assistant, radio.   See discussions below:   Length of Stay: 8  Current Medications: Scheduled Meds:  . enoxaparin (LOVENOX) injection  40 mg Subcutaneous Q24H  . feeding supplement (PRO-STAT SUGAR FREE 64)  30 mL Per Tube BID  . insulin aspart  0-15 Units Subcutaneous Q4H  . pantoprazole (PROTONIX) IV  40 mg Intravenous Q24H    Continuous Infusions: . feeding supplement (OSMOLITE 1.5 CAL) 1,000 mL (01/13/17 2147)  . lactated ringers 10 mL/hr at 01/10/17 1014    PRN Meds: acetaminophen (TYLENOL) oral liquid 160 mg/5 mL, hydrALAZINE, HYDROcodone-acetaminophen, morphine injection, morphine CONCENTRATE, ondansetron **OR** ondansetron (ZOFRAN) IV, sodium chloride flush, Trazodone Compounded Suspension 10mg /mL  Physical Exam         Awake alert A little hard of hearing Appears weak Thin extremities PEG tube site is clean, tolerating tube feedings well Regular Regular work of breathing No edema Non focal  Vital Signs: BP (!) 148/70 (BP Location: Left Arm)   Pulse 87   Temp 98.8 F (37.1 C)   Resp 20   Ht 5\' 10"  (1.778 m)   Wt 76.5 kg (168 lb 10.4 oz)   SpO2 94%   BMI 24.20 kg/m  SpO2:  SpO2: 94 % O2 Device: O2 Device: Not Delivered O2 Flow Rate: O2 Flow Rate (L/min): 2 L/min  Intake/output summary:   Intake/Output Summary (Last 24 hours) at 01/14/2017 1355 Last data filed at 01/14/2017 1302 Gross per 24 hour  Intake 1303 ml  Output 550 ml  Net 753 ml   LBM: Last BM Date: 01/10/17 Baseline Weight: Weight: 72 kg (158 lb 11.7 oz) Most recent weight: Weight: 76.5 kg (168 lb 10.4 oz)       Palliative Assessment/Data:  PPS 40%   Flowsheet Rows     Most Recent Value  Intake Tab  Referral Department  Hospitalist  Unit at Time of Referral  Oncology Unit  Palliative Care Primary Diagnosis  Cancer  Date Notified  01/12/17  Palliative Care Type  New Palliative care  Reason for referral  Clarify Goals of Care  Date of Admission  01/06/17  Date first seen by Palliative Care  01/13/17  # of days Palliative referral response time  1 Day(s)  # of days IP prior to Palliative referral  6  Clinical Assessment  Palliative Performance Scale Score  30%  Pain Max last 24 hours  4  Pain Min Last 24 hours  3  Dyspnea Max Last 24 Hours  3  Dyspnea Min Last 24 hours  2  Psychosocial & Spiritual Assessment  Palliative Care Outcomes      Patient Active Problem List   Diagnosis Date Noted  . Encounter for palliative care   . Orthostatic dizziness   . Bradycardia   . Generalized weakness   . Severe protein-calorie malnutrition ([place])   . Dehydration 01/06/2017  . DM (diabetes mellitus) ([place]) 01/04/2017  . Hyperlipidemia 01/04/2017  . Goals of care, counseling/discussion 12/23/2016  . Pancreatic adenocarcinoma ([place]) 12/22/2016    Palliative Care Assessment & Plan   Patient Profile:    Assessment:  unresectable adeno ca pancreas Partial duodenal obstruction Nausea/vomiting/dehydration on admission DM HTN GERD S/p J tube placement.   Recommendations/Plan:   Goals of care discussions in family meeting:   Patient and his wife have lived in [place] all his  life, he is a Scientist, product/process development, they have 3 kids, he recently moved in with his daughter who lives in [place], [place]. [place]   The patient understands well about his condition. He remains hopeful for additional chemotherapy and meaningful prolongation of life. He wants to continue tube feeds and current mode of care.   Differences between home with home health care versus home with hospice support discussed in detail, also discussed that some hospices consider short term tube feedings on a case by case basis.   At this point, the patient's goals are to go home, to continue tube feeds, to be hopeful of resuming chemotherapy very soon. Goals, at this point, are not inclined towards hospice mode of care.   Call placed and discussed frankly and compassionately with daughter Malachy Mood, she states that the patient might require hospice sooner than later. I acknowledged with her this possibility, also have discussed with her about gathering preliminary information about the nearest hospice agency so as to be better prepared for when the patient does transition to a hospice mode of care.   All of the patient's and family's questions answered to the best of my ability.   Home with home health care on D/C. Home nursing, home tube feedings, home PT, then to follow up with oncology Dr Burr Medico.    Code Status:    Code Status Orders  (From admission, onward)        Start     Ordered   01/06/17 2059  Do not attempt resuscitation (DNR)  Continuous    Question Answer Comment  In the event of cardiac or respiratory ARREST Do not call a "code blue"   In the event of cardiac or respiratory ARREST Do not perform Intubation, CPR, defibrillation or ACLS   In the event of cardiac or respiratory ARREST Use medication by any route,  position, wound care, and other measures to relive pain and suffering. May use oxygen, suction and manual treatment of airway obstruction as needed for comfort.       01/06/17 2058    Code Status History    Date Active Date Inactive Code Status Order ID Comments User Context   This patient has a current code status but no historical code status.    Advance Directive Documentation     Most Recent Value  Type of Advance Directive  Healthcare Power of Attorney, Living will  Pre-existing out of facility DNR order (yellow form or pink MOST form)  No data  "MOST" Form in Place?  No data       Prognosis:   guarded   Discharge Planning:  Home with [place] was discussed with  Patient, wife, RN, daughter Malachy Mood who is an Therapist, sports.   Thank you for allowing the Palliative Medicine Team to assist in the care of this patient.   Time In: 1300 Time Out: 1335 Total Time 35 Prolonged Time Billed  no       Greater than 50%  of this time was spent counseling and coordinating care related to the above assessment and plan.  Loistine Chance, MD [phone]  Please contact Palliative Medicine Team phone at [phone] for questions and concerns.

## 2017-01-14 NOTE — Progress Notes (Addendum)
Physical Therapy Treatment Patient Details Name: Cody Mckay MRN: [id number] DOB: 1932/09/13 Today's Date: 01/14/2017    History of Present Illness 83 yo male admitted with dehydration, dizziness, s/p jejunostomy 01/10/17. Hx of pancreatic cancer-on chemo, dm    PT Comments    Progressing well; incr standing  Tolerance today although too fatigued afterward to amb; continue to recommend HHPT;  Follow Up Recommendations  Home health PT;Supervision/Assistance - 24 hour     Equipment Recommendations  Rolling walker with 5" wheels    Recommendations for Other Services       Precautions / Restrictions Precautions Precautions: Fall Precaution Comments: feeding tube    Mobility  Bed Mobility Overal bed mobility: Needs Assistance Bed Mobility: Supine to Sit     Supine to sit: Supervision;HOB elevated     General bed mobility comments: for safety, lines  Transfers Overall transfer level: Needs assistance Equipment used: Rolling walker (2 wheeled) Transfers: Sit to/from [facility] Sit to Stand: Min guard;From elevated surface Stand pivot transfers: Min guard  stand pivot x2, bed to [facility] and  ~6 pivotal steps from [facility] to chair with RW     General transfer comment: close guard for safety. VCs hand placement.   Ambulation/Gait                 Stairs            Wheelchair Mobility    Modified Rankin (Stroke Patients Only)       Balance Overall balance assessment: Needs assistance         Standing balance support: Single extremity supported;No upper extremity supported;During functional activity Standing balance-Leahy Scale: Good Standing balance comment: pt is able to stand and perform peri-care with close supervision for safety and line management;  able to shift ~ 7" out of BOS with min/guard for safety;                               Cognition Arousal/Alertness: Awake/alert Behavior During Therapy: WFL for tasks  assessed/performed Overall Cognitive Status: Within Functional Limits for tasks assessed                                        Exercises      General Comments        Pertinent Vitals/Pain      Home Living                      Prior Function            PT Goals (current goals can now be found in the care plan section) Acute Rehab PT Goals Patient Stated Goal: to go home today PT Goal Formulation: With patient Time For Goal Achievement: 01/26/17 Potential to Achieve Goals: Good Progress towards PT goals: Progressing toward goals    Frequency    Min 3X/week      PT Plan Current plan remains appropriate    Co-evaluation              AM-PAC PT "6 Clicks" Daily Activity  Outcome Measure  Difficulty turning over in bed (including adjusting bedclothes, sheets and blankets)?: A Little Difficulty moving from lying on back to sitting on the side of the bed? : A Little Difficulty sitting down on and standing up from a chair with arms (  e.g., wheelchair, bedside commode, etc,.)?: A Little Help needed moving to and from a bed to chair (including a wheelchair)?: A Little Help needed walking in hospital room?: A Little Help needed climbing 3-5 steps with a railing? : A Little 6 Click Score: 18    End of Session Equipment Utilized During Treatment: Gait belt Activity Tolerance: Patient tolerated treatment well Patient left: in chair;with family/visitor present;with chair alarm set   PT Visit Diagnosis: Muscle weakness (generalized) (M62.81);Difficulty in walking, not elsewhere classified (R26.2)     Time: 3779-3968 PT Time Calculation (min) (ACUTE ONLY): 27 min  Charges:  $Therapeutic Activity: 8-22 mins $Self Care/Home Management: 8-22                    G Codes:      Kenyon Ana, PT Pager: [phone] 01/14/2017    Kenyon Ana 01/14/2017, 1:13 PM

## 2017-01-14 NOTE — Progress Notes (Signed)
Cody Mckay   DOB:12/06/1932   IR#:[id number]   QPY#:[id number]  ONCOLOGY F/U NOTE  Subjective: Cody Mckay is tolerating tube feeding well, on 58ml/hr now, has some diarrhea, no pain or other new complains.   Objective:  Vitals:   01/14/17 0403 01/14/17 0514  BP: (!) 148/70   Pulse: 87   Resp: 20   Temp: (!) 100.6 F (38.1 C) 98.8 F (37.1 C)  SpO2: 94%     Body mass index is 24.2 kg/m.  Intake/Output Summary (Last 24 hours) at 01/14/2017 1032 Last data filed at 01/14/2017 0600 Gross per 24 hour  Intake 1303 ml  Output -  Net 1303 ml     Sclerae unicteric  Oropharynx clear  No peripheral adenopathy  Lungs clear -- no rales or rhonchi  Heart regular rate and rhythm  Abdomen benign, laparoscopic surgical incisions are healing well, (+) G-tube in LUQ of abdomen   MSK no focal spinal tenderness, no peripheral edema  Neuro nonfocal    CBG (last 3)  Recent Labs    01/14/17 0028 01/14/17 0355 01/14/17 0759  GLUCAP 193* 228* 194*     Labs:  Lab Results  Component Value Date   WBC 17.6 (H) 01/12/2017   HGB 10.2 (L) 01/12/2017   HCT 31.1 (L) 01/12/2017   MCV 90.4 01/12/2017   PLT 318 01/12/2017   NEUTROABS 11.3 (H) 01/06/2017   CMP Latest Ref Rng & Units 01/12/2017 01/11/2017 01/10/2017  Glucose 65 - 99 mg/dL 152(H) 186(H) 179(H)  BUN 6 - 20 mg/dL 6 7 8   Creatinine 0.61 - 1.24 mg/dL 0.52(L) 0.61 0.61  Sodium 135 - 145 mmol/L 136 138 139  Potassium 3.5 - 5.1 mmol/L 3.2(L) 3.3(L) 3.6  Chloride 101 - 111 mmol/L 102 105 107  CO2 22 - 32 mmol/L 29 28 24   Calcium 8.9 - 10.3 mg/dL 7.7(L) 7.9(L) 8.0(L)  Total Protein 6.5 - 8.1 g/dL - - -  Total Bilirubin 0.3 - 1.2 mg/dL - - -  Alkaline Phos 38 - 126 U/L - - -  AST 15 - 41 U/L - - -  ALT 17 - 63 U/L - - -     Urine Studies No results for input(s): UHGB, CRYS in the last 72 hours.  Invalid input(s): UACOL, UAPR, USPG, UPH, UTP, UGL, UKET, UBIL, UNIT, UROB, [place], UEPI, UWBC, Junie Panning [place], [place],  [place]  Basic Metabolic Panel: Recent Labs  Lab 01/07/17 1040 01/09/17 0447 01/10/17 0356 01/11/17 0510 01/12/17 0400  NA 139 138 139 138 136  K 3.4* 3.4* 3.6 3.3* 3.2*  CL 110 104 107 105 102  CO2 22 23 24 28 29   GLUCOSE 85 130* 179* 186* 152*  BUN 13 7 8 7 6   CREATININE 0.68 0.58* 0.61 0.61 0.52*  CALCIUM 8.2* 8.2* 8.0* 7.9* 7.7*  MG  --  1.4* 1.7  --   --    GFR Estimated Creatinine Clearance: 73.5 mL/min (A) (by C-G formula based on SCr of 0.52 mg/dL (L)). Liver Function Tests: Recent Labs  Lab 01/07/17 1040  AST 52*  ALT 40  ALKPHOS 73  BILITOT 0.6  PROT 5.3*  ALBUMIN 2.3*   No results for input(s): LIPASE, AMYLASE in the last 168 hours. No results for input(s): AMMONIA in the last 168 hours. Coagulation profile Recent Labs  Lab 01/07/17 1040  INR 1.41    CBC: Recent Labs  Lab 01/07/17 1040 01/09/17 0447 01/10/17 0356 01/11/17 0510 01/12/17 0400  WBC 14.4* 16.1* 17.6* 18.4*  17.6*  HGB 9.4* 10.7* 10.3* 9.7* 10.2*  HCT 28.8* 32.8* 31.3* 29.8* 31.1*  MCV 90.0 90.4 89.9 90.9 90.4  PLT 304 374 366 327 318   Cardiac Enzymes: Recent Labs  Lab 01/07/17 2359  TROPONINI <0.03   BNP: Invalid input(s): POCBNP CBG: Recent Labs  Lab 01/13/17 1753 01/13/17 2104 01/14/17 0028 01/14/17 0355 01/14/17 0759  GLUCAP 280* 227* 193* 228* 194*   D-Dimer No results for input(s): DDIMER in the last 72 hours. Hgb A1c No results for input(s): HGBA1C in the last 72 hours. Lipid Profile No results for input(s): CHOL, HDL, LDLCALC, TRIG, CHOLHDL, LDLDIRECT in the last 72 hours. Thyroid function studies No results for input(s): TSH, T4TOTAL, T3FREE, THYROIDAB in the last 72 hours.  Invalid input(s): FREET3 Anemia work up No results for input(s): VITAMINB12, FOLATE, FERRITIN, TIBC, IRON, RETICCTPCT in the last 72 hours. Microbiology Recent Results (from the past 240 hour(s))  Culture, Blood     Status: None   Collection Time: 01/04/17 11:57 AM  Result  Value Ref Range Status   BLOOD CULTURE, ROUTINE Final report  Final   Organism ID, Bacteria Comment  Final    Comment: No aerobic or anaerobic growth in five days.  Culture, Blood     Status: None   Collection Time: 01/04/17 11:57 AM  Result Value Ref Range Status   BLOOD CULTURE, ROUTINE Final report  Final   Organism ID, Bacteria Comment  Final    Comment: No aerobic or anaerobic growth in five days.  TECHNOLOGIST REVIEW     Status: None   Collection Time: 01/05/17 10:16 AM  Result Value Ref Range Status   Technologist Review Rare meta, few ovalos  Final  Stool culture (children & immunocomp patients)     Status: None   Collection Time: 01/05/17 11:15 AM  Result Value Ref Range Status   Salmonella/Shigella Screen Final report  Final   Campylobacter Culture Final report  Final   E coli, Shiga toxin Assay Negative Negative Final    Comment: (NOTE) Performed At: [facility] [place], [place] [id number] Rush Farmer MD ZH:[id number]   STOOL CULTURE REFLEX - RSASHR     Status: None   Collection Time: 01/05/17 11:15 AM  Result Value Ref Range Status   Stool Culture result 1 (RSASHR) Comment  Final    Comment: (NOTE) No Salmonella or Shigella recovered. Performed At: [facility] [address] [place], [place] [id number] Rush Farmer MD GM:[id number]   STOOL CULTURE Reflex - CMPCXR     Status: None   Collection Time: 01/05/17 11:15 AM  Result Value Ref Range Status   Stool Culture result 1 (CMPCXR) Comment  Final    Comment: (NOTE) No Campylobacter species isolated. Performed At: [facility] [place], [place] [id number] Rush Farmer MD ZD:[id number]   Surgical pcr screen     Status: None   Collection Time: 01/09/17 12:26 PM  Result Value Ref Range Status   MRSA, PCR NEGATIVE NEGATIVE Final   Staphylococcus aureus NEGATIVE NEGATIVE Final    Comment: (NOTE) The Xpert SA Assay (FDA approved for NASAL specimens in  patients 48 years of age and older), is one component of a comprehensive surveillance program. It is not intended to diagnose infection nor to guide or monitor treatment.       Studies:  No results found.  Assessment: 83 y.o. male with a history of abnormal EKG, right bundle branch block, bleeding gastric ulcer, Diverticulosis, DM, and  HTN, who was recently diagnosed with pancreatic head adenocarcinoma   1. 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  Partial duodenal obstruction secondary to #1, s/p G-tube placement 12/10 3.  Nausea, vomiting, dehydration, improved  4. DM 5. HTN 6. GERD 7.  Severe protein and calorie malnutrition 8. DNR/DNI   Recommendations -appreciate Dr. Rowe Pavy seeing him yesterday, pt and his wife would like to see how he recovers, he is not ready for hospice or giving up chemo yet, although he understand he would need hospice at some point.  -His daughter was wondering if he needs to go home with hospice program but will respect what ever pt decides, plus hospice may not cover routine tube feeding. Pt does not want to go to rehab, so likely go home with home care service  -I will plan to see him back on 12/28 for f/u and will decide if his nutrition and overall condition improves enough for him to try chemo again.    Truitt Merle, MD 01/14/2017  10:32 AM

## 2017-01-14 NOTE — Progress Notes (Signed)
Split gauze around J tube was changed twice today after the pt complained of it "leaking" and feeling wet.  Clear fluid seems to be slowly leaking around the insertion site.  Will continue to monitor.

## 2017-01-15 DIAGNOSIS — C259 Malignant neoplasm of pancreas, unspecified: Secondary | ICD-10-CM

## 2017-01-15 DIAGNOSIS — L8990 Pressure ulcer of unspecified site, unspecified stage: Secondary | ICD-10-CM

## 2017-01-15 LAB — COMPREHENSIVE METABOLIC PANEL
ALBUMIN: 2 g/dL — AB (ref 3.5–5.0)
ALT: 34 U/L (ref 17–63)
ANION GAP: 6 (ref 5–15)
AST: 49 U/L — ABNORMAL HIGH (ref 15–41)
Alkaline Phosphatase: 68 U/L (ref 38–126)
BILIRUBIN TOTAL: 0.6 mg/dL (ref 0.3–1.2)
BUN: 17 mg/dL (ref 6–20)
CO2: 31 mmol/L (ref 22–32)
Calcium: 7.8 mg/dL — ABNORMAL LOW (ref 8.9–10.3)
Chloride: 100 mmol/L — ABNORMAL LOW (ref 101–111)
Creatinine, Ser: 0.51 mg/dL — ABNORMAL LOW (ref 0.61–1.24)
GFR calc non Af Amer: >60 mL/min (ref >60)
GLUCOSE: 177 mg/dL — AB (ref 65–99)
POTASSIUM: 3.1 mmol/L — AB (ref 3.5–5.1)
SODIUM: 137 mmol/L (ref 135–145)
TOTAL PROTEIN: 5.3 g/dL — AB (ref 6.5–8.1)

## 2017-01-15 LAB — MAGNESIUM: Magnesium: 1.7 mg/dL (ref 1.7–2.4)

## 2017-01-15 LAB — GLUCOSE, CAPILLARY
GLUCOSE-CAPILLARY: 234 mg/dL — AB (ref 65–99)
Glucose-Capillary: 187 mg/dL — ABNORMAL HIGH (ref 65–99)
Glucose-Capillary: 210 mg/dL — ABNORMAL HIGH (ref 65–99)
Glucose-Capillary: 243 mg/dL — ABNORMAL HIGH (ref 65–99)
Glucose-Capillary: 247 mg/dL — ABNORMAL HIGH (ref 65–99)
Glucose-Capillary: 261 mg/dL — ABNORMAL HIGH (ref 65–99)

## 2017-01-15 LAB — CBC WITH DIFFERENTIAL/PLATELET
BASOS PCT: 0 %
Basophils Absolute: 0.1 10*3/uL (ref 0.0–0.1)
EOS ABS: 0.3 10*3/uL (ref 0.0–0.7)
Eosinophils Relative: 2 %
HEMATOCRIT: 30.1 % — AB (ref 39.0–52.0)
Hemoglobin: 9.8 g/dL — ABNORMAL LOW (ref 13.0–17.0)
Lymphocytes Relative: 6 %
Lymphs Abs: 0.8 10*3/uL (ref 0.7–4.0)
MCH: 29.3 pg (ref 26.0–34.0)
MCHC: 32.6 g/dL (ref 30.0–36.0)
MCV: 89.9 fL (ref 78.0–100.0)
MONO ABS: 1 10*3/uL (ref 0.1–1.0)
MONOS PCT: 6 %
Neutro Abs: 13.1 10*3/uL — ABNORMAL HIGH (ref 1.7–7.7)
Neutrophils Relative %: 86 %
Platelets: 258 10*3/uL (ref 150–400)
RBC: 3.35 MIL/uL — ABNORMAL LOW (ref 4.22–5.81)
RDW: 14.9 % (ref 11.5–15.5)
WBC: 15.2 10*3/uL — ABNORMAL HIGH (ref 4.0–10.5)

## 2017-01-15 LAB — PHOSPHORUS: PHOSPHORUS: 1.8 mg/dL — AB (ref 2.5–4.6)

## 2017-01-15 MED ORDER — POTASSIUM PHOSPHATE MONOBASIC 500 MG PO TABS
500.0000 mg | ORAL_TABLET | Freq: Three times a day (TID) | ORAL | Status: DC
  Filled 2017-01-15 (×5): qty 1

## 2017-01-15 NOTE — Progress Notes (Signed)
PROGRESS NOTE    Cody Mckay  ZES:[id number] DOB: Feb 06, 1932 DOA: 01/06/2017 PCP: Dorothyann Peng, NP   Brief Narrative: 83 y.o. 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  Assessment & Plan:  #Severe protein calorie malnutrition, dehydration due to poor intake in the setting of pancreatic malignancy: -Evaluated by oncologist and general surgery.  Status post jejunostomy feeding tube placement by general surgery on 12/10.   -continue tube feeding currently at goal and continue hydration.  -Patient would continue to be strict n.p.o. per general surgery.   -Some leakage around the J-tube, discussed with general surgery recommend outpatient follow-up. -palliative care consulted; looking to go home with [facility] services and follow clinical response; most likely down the road home with hospice.  #Pancreatic cancer, adenocarcinoma in head of pancreas: Unresectable.  There is partial to complete duodenal obstruction secondary to tumor.   -As per oncology, it was reviewed with GI Dr. Ardis Hughs, who doesn't think this is feasible for endoscopic stenting. -Continue supportive care and symptom management. -palliative care consulted, appreciate [place] discussion. -will continue PRN analgesics and pursuit [place] services. depending how progression or decline would benefit of hospice follow up. -patient to continue follow up with Dr. Burr Medico (next appointment 12/20 eighth/18).  #Dizziness likely contributed by orthostatic hypotension and bradycardia: Received IV fluid bolus and hydration with improvement in symptoms.   -CT scan of head with no acute finding.   -Echo unremarkable.   -no abnormalities seen on telemetry; telemetry discontinued  -seen by PT/OT and demonstrated good balance and no complaints with ambulation. Will  discharge home with HHPT/HHOT.  #Type 2 diabetes:  -Continue sliding scale.   -Monitor blood sugar level intermittently -Anticipating some elevation in his blood sugar levels with tube feedings. -will add low dose lantus.  #Hypertension:  -Stable and well-controlled -Continue as needed hydralazine for elevated blood pressure. -No antihypertensive regimen has been required prior to admission.  #GERD:  -Continue PPI -Currently IV given strict NPO status -At discharge might be able to use in suspension form to be given through jejunostomy tube with proper flushing.  # Hypokalemia:  -Repleted -Continue intermittently checking electrolytes trend for now. -Receiving IV fluids with some potassium in it. -Patient also on tube feedings now.    #Persistent leukocytosis likely due to demargination, dehydration and malignancy:  -No signs of UTI or pneumonia on workup -Patient is afebrile  -Continue supportive care.   #hyperlipidemia -Will discontinue niacin and Lipitor at this moment -Currently unable to take medications with current available route   DVT prophylaxis: Lovenox sq Code Status: DNR Family Communication: Wife and daughter at bedside Disposition Plan: Remains inpatient; case manager to assist with home tube feeding setting and home health.  Will arrange for discharge tomorrow morning.    Consultants:   Oncology  General surgery  Procedures: Feeding tube placement Antimicrobials: None  Subjective: No fever, no vomiting, no significant abdominal discomfort.  No significant diarrhea reported.  Some leakage around the J-tube  Objective: Vitals:   01/14/17 1451 01/14/17 2019 01/15/17 0431 01/15/17 1500  BP: (!) 143/74 (!) 143/74 (!) 142/77 128/69  Pulse: 85 82 81 86  Resp: 18 19 20 18   Temp: 98.6 F (37 C) 98.4 F (36.9 C) 98.3 F (36.8 C) 98.7 F (37.1 C)  TempSrc: Oral Oral Oral Oral  SpO2: 93% 97% 96% 98%  Weight:   68.9  kg (152 lb)   Height:         Intake/Output Summary (Last 24 hours) at 01/15/2017 1721 Last data filed at 01/15/2017 0900 Gross per 24 hour  Intake 1440 ml  Output 300 ml  Net 1140 ml   Filed Weights   01/13/17 0415 01/14/17 0514 01/15/17 0431  Weight: 70.9 kg (156 lb 4.9 oz) 76.5 kg (168 lb 10.4 oz) 68.9 kg (152 lb)    Examination: General exam: Afebrile, cachectic, chronically ill and frail on exam; no chest pain, no nausea, no vomiting.  Having bowel movements now.  Reports on nausea.  Respiratory system: Good air movement bilaterally, no wheezing, no crackles; good oxygen saturation on room air and with normal respiratory effort.  Cardiovascular system: RRR, positive soft systolic ejection murmur, no rubs, no gallops, no JVD.   Gastrointestinal system: Soft, mild tenderness to palpation, no guarding, positive bowel sounds; jejunostomy tube in place without any surrounding erythema or drainage. Central nervous system: Alert, awake and oriented x3; cranial nerve grossly intact; no focal deficit. Skin: No rashes, no petechiae, no open ulcers; jejunostomy tube in place.   Data Reviewed: I have personally reviewed following labs and imaging studies  CBC: Recent Labs  Lab 01/09/17 0447 01/10/17 0356 01/11/17 0510 01/12/17 0400  WBC 16.1* 17.6* 18.4* 17.6*  HGB 10.7* 10.3* 9.7* 10.2*  HCT 32.8* 31.3* 29.8* 31.1*  MCV 90.4 89.9 90.9 90.4  PLT 374 366 327 127   Basic Metabolic Panel: Recent Labs  Lab 01/09/17 0447 01/10/17 0356 01/11/17 0510 01/12/17 0400  NA 138 139 138 136  K 3.4* 3.6 3.3* 3.2*  CL 104 107 105 102  CO2 23 24 28 29   GLUCOSE 130* 179* 186* 152*  BUN 7 8 7 6   CREATININE 0.58* 0.61 0.61 0.52*  CALCIUM 8.2* 8.0* 7.9* 7.7*  MG 1.4* 1.7  --   --    GFR: Estimated Creatinine Clearance: 69.4 mL/min (A) (by C-G formula based on SCr of 0.52 mg/dL (L)).   Cardiac Enzymes: No results for input(s): CKTOTAL, CKMB, CKMBINDEX, TROPONINI in the last 168 hours. CBG: Recent Labs  Lab  01/15/17 0008 01/15/17 0423 01/15/17 0812 01/15/17 1206 01/15/17 1649  GLUCAP 247* 210* 261* 243* 234*    Recent Results (from the past 240 hour(s))  Surgical pcr screen     Status: None   Collection Time: 01/09/17 12:26 PM  Result Value Ref Range Status   MRSA, PCR NEGATIVE NEGATIVE Final   Staphylococcus aureus NEGATIVE NEGATIVE Final    Comment: (NOTE) The Xpert SA Assay (FDA approved for NASAL specimens in patients 64 years of age and older), is one component of a comprehensive surveillance program. It is not intended to diagnose infection nor to guide or monitor treatment.      Scheduled Meds: . enoxaparin (LOVENOX) injection  40 mg Subcutaneous Q24H  . feeding supplement (PRO-STAT SUGAR FREE 64)  30 mL Per Tube BID  . insulin aspart  0-15 Units Subcutaneous Q4H  . insulin glargine  5 Units Subcutaneous QHS  . pantoprazole (PROTONIX) IV  40 mg Intravenous Q24H   Continuous Infusions: . feeding supplement (OSMOLITE 1.5 CAL) 1,000 mL (01/14/17 1942)  . lactated ringers 10 mL/hr at 01/10/17 1014     LOS: 9 days    Author:  Berle Mull, MD [facility] Pager: [phone] 01/15/2017 5:25 PM     If 7PM-7AM, please contact night-coverage [email or web address] Password [email or web address] 01/15/2017, 5:21 PM

## 2017-01-16 ENCOUNTER — Inpatient Hospital Stay (HOSPITAL_COMMUNITY): Payer: Medicare Other

## 2017-01-16 LAB — GLUCOSE, CAPILLARY
GLUCOSE-CAPILLARY: 171 mg/dL — AB (ref 65–99)
GLUCOSE-CAPILLARY: 129 mg/dL — AB (ref 65–99)
Glucose-Capillary: 135 mg/dL — ABNORMAL HIGH (ref 65–99)
Glucose-Capillary: 181 mg/dL — ABNORMAL HIGH (ref 65–99)
Glucose-Capillary: 252 mg/dL — ABNORMAL HIGH (ref 65–99)
Glucose-Capillary: 93 mg/dL (ref 65–99)

## 2017-01-16 LAB — BASIC METABOLIC PANEL
Anion gap: 7 (ref 5–15)
BUN: 17 mg/dL (ref 6–20)
CO2: 31 mmol/L (ref 22–32)
Calcium: 7.8 mg/dL — ABNORMAL LOW (ref 8.9–10.3)
Chloride: 99 mmol/L — ABNORMAL LOW (ref 101–111)
Creatinine, Ser: 0.48 mg/dL — ABNORMAL LOW (ref 0.61–1.24)
GFR calc Af Amer: >60 mL/min (ref >60)
GLUCOSE: 188 mg/dL — AB (ref 65–99)
POTASSIUM: 3.1 mmol/L — AB (ref 3.5–5.1)
Sodium: 137 mmol/L (ref 135–145)

## 2017-01-16 LAB — MAGNESIUM: Magnesium: 1.8 mg/dL (ref 1.7–2.4)

## 2017-01-16 MED ORDER — POTASSIUM CHLORIDE 20 MEQ/15ML (10%) PO SOLN
40.0000 meq | Freq: Once | ORAL | Status: AC
  Administered 2017-01-16: 40 meq
  Filled 2017-01-16: qty 30

## 2017-01-16 MED ORDER — PANCRELIPASE (LIP-PROT-AMYL) 12000-38000 UNITS PO CPEP
24000.0000 [IU] | ORAL_CAPSULE | Freq: Once | ORAL | Status: AC
  Administered 2017-01-16: 24000 [IU] via ORAL
  Filled 2017-01-16: qty 2

## 2017-01-16 MED ORDER — PANTOPRAZOLE SODIUM 40 MG PO PACK
40.0000 mg | PACK | Freq: Every day | ORAL | Status: DC
  Administered 2017-01-17: 40 mg
  Filled 2017-01-16 (×2): qty 20

## 2017-01-16 MED ORDER — BISACODYL 10 MG RE SUPP: 10.0000 mg | Freq: Every day | RECTAL | Status: DC | PRN

## 2017-01-16 MED ORDER — FLEET ENEMA 7-19 GM/118ML RE ENEM: 1.0000 | ENEMA | Freq: Every day | RECTAL | Status: DC | PRN

## 2017-01-16 MED ORDER — FREE WATER
100.0000 mL | Freq: Three times a day (TID) | Status: DC
  Administered 2017-01-17 (×2): 100 mL

## 2017-01-16 MED ORDER — SODIUM BICARBONATE 650 MG PO TABS
650.0000 mg | ORAL_TABLET | Freq: Once | ORAL | Status: AC
  Administered 2017-01-16: 650 mg via ORAL
  Filled 2017-01-16: qty 1

## 2017-01-16 MED ORDER — POTASSIUM CHLORIDE 20 MEQ PO PACK: 40.0000 meq | PACK | Freq: Once | ORAL | Status: DC

## 2017-01-16 NOTE — Progress Notes (Signed)
Writer has tried to deliver ordered meds to pt thru G-Tube without success. G-tube has clogged. Tried to flush with warm water and air without success. Will alert MD.

## 2017-01-16 NOTE — Care Management Note (Addendum)
Subjective/Objective:   Severe protein calorie malnutrition, pancreatic malignancy,  Will dc home with tube feedings                Action/Plan: Discharge Planning: NCM spoke to pt, dtr, Malachy Mood and wife, Dorothy at bedside. Offered choice for [facility]. Dtr states he had [facility] in the past. Contacted [facility] for tube feedings and equipment (delivered to home) [facility] for RW (delivered to room prior to dc). Dtr is requesting PTAR transport to home. Attending notified to add free water and calorie count to tube feeding order.     PCP Dorothyann Peng MD   Expected Discharge Date:             Expected Discharge Plan:  [place]  In-House Referral:  NA  Discharge planning Services  CM Consult  Post Acute Care Choice:  Home Health Choice offered to:  Patient, Adult Children  DME Arranged:  Walker rolling, Tube feeding pump, Tube feeding DME Agency:  [place]:  RN, PT, OT, Nurse's Aide, Social Work [employer] Agency:  [place]  Status of Service:  In process, will continue to follow  If discussed at Long Length of Stay Meetings, dates discussed:    Additional Comments:  Erenest Rasher, RN 01/16/2017, 4:06 PM

## 2017-01-16 NOTE — Progress Notes (Signed)
PROGRESS NOTE    Cody Mckay  NUU:[id number] DOB: October 03, 1932 DOA: 01/06/2017 PCP: Dorothyann Peng, NP   Brief Narrative: 83 y.o. 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  Assessment & Plan:  #Severe protein calorie malnutrition, dehydration due to poor intake in the setting of pancreatic malignancy: -Evaluated by oncologist and general surgery.  Status post jejunostomy feeding tube placement by general surgery on 12/10.   -continue tube feeding currently at goal and continue hydration.  -Patient would continue to be strict n.p.o. per general surgery.   -Some leakage around the J-tube, discussed with general surgery recommend outpatient follow-up. -palliative care consulted; looking to go home with [facility] services and follow clinical response; most likely down the road home with hospice.  #Pancreatic cancer, adenocarcinoma in head of pancreas: Unresectable.  There is partial to complete duodenal obstruction secondary to tumor.   -As per oncology, it was reviewed with GI Dr. Ardis Hughs, who doesn't think this is feasible for endoscopic stenting. -Continue supportive care and symptom management. -palliative care consulted, appreciate [place] discussion. -will continue PRN analgesics and pursuit [place] services. depending how progression or decline would benefit of hospice follow up. -patient to continue follow up with Dr. Burr Medico (next appointment 12/20 eighth/18).  #Dizziness likely contributed by orthostatic hypotension and bradycardia: Received IV fluid bolus and hydration with improvement in symptoms.   -CT scan of head with no acute finding.   -Echo unremarkable.   -no abnormalities seen on telemetry; telemetry discontinued  -seen by PT/OT and demonstrated good balance and no complaints with ambulation. Will  discharge home with HHPT/HHOT.  #Type 2 diabetes:  -Continue sliding scale.   -Monitor blood sugar level intermittently -Anticipating some elevation in his blood sugar levels with tube feedings. -will add low dose lantus.  #Hypertension:  -Stable and well-controlled -Continue as needed hydralazine for elevated blood pressure. -No antihypertensive regimen has been required prior to admission.  #GERD:  -Continue PPI -Currently IV given strict NPO status -At discharge might be able to use in suspension form to be given through jejunostomy tube with proper flushing.  # Hypokalemia:  -Repleted -Continue intermittently checking electrolytes trend for now. -Receiving IV fluids with some potassium in it. -Patient also on tube feedings now.    #Persistent leukocytosis likely due to demargination, dehydration and malignancy:  -No signs of UTI or pneumonia on workup -Patient is afebrile  -Continue supportive care.   #hyperlipidemia -Will discontinue niacin and Lipitor at this moment -Currently unable to take medications with current available route   J-tube dysfunction. Patient's J-tube appears to be clogged, discussed with general surgery on phone they would like to initiate unclogging protocol right now and will follow up with the patient.  Currently tube feeds are on hold.  DVT prophylaxis: Lovenox sq Code Status: DNR Family Communication: No family at bedside Disposition Plan: Remains inpatient; case manager to assist with home tube feeding setting and home health.  Will arrange for discharge tomorrow morning.    Consultants:   Oncology  General surgery  Procedures: Feeding tube placement Antimicrobials: None  Subjective: No fever, no vomiting, his J-tube appears to be clogged, patient mentions he is unable to have a bowel movement and has some discomfort from that.  Objective: Vitals:   01/15/17 2048 01/16/17 0514 01/16/17 0650 01/16/17 1433  BP: (!) 148/78 (!)  146/73  130/61  Pulse: 89  77  66  Resp: 18 18  20   Temp: 98.2 F (36.8 C) 97.7 F (36.5 C)  98.3 F (36.8 C)  TempSrc: Oral Oral  Oral  SpO2: 98% 97%  99%  Weight:   69.5 kg (153 lb 3.5 oz)   Height:        Intake/Output Summary (Last 24 hours) at 01/16/2017 1608 Last data filed at 01/16/2017 1204 Gross per 24 hour  Intake 0 ml  Output 852 ml  Net -852 ml   Filed Weights   01/14/17 0514 01/15/17 0431 01/16/17 0650  Weight: 76.5 kg (168 lb 10.4 oz) 68.9 kg (152 lb) 69.5 kg (153 lb 3.5 oz)    Examination: General exam: Afebrile, cachectic, chronically ill and frail on exam; no chest pain, no nausea, no vomiting.  Having bowel movements now.  Reports on nausea.  Respiratory system: Good air movement bilaterally, no wheezing, no crackles; good oxygen saturation on room air and with normal respiratory effort.  Cardiovascular system: RRR, positive soft systolic ejection murmur, no rubs, no gallops, no JVD.   Gastrointestinal system: Soft, mild tenderness to palpation, no guarding, positive bowel sounds; jejunostomy tube in place without any surrounding erythema or drainage. Central nervous system: Alert, awake and oriented x3; cranial nerve grossly intact; no focal deficit. Skin: No rashes, no petechiae, no open ulcers; jejunostomy tube in place.   Data Reviewed: I have personally reviewed following labs and imaging studies  CBC: Recent Labs  Lab 01/10/17 0356 01/11/17 0510 01/12/17 0400 01/15/17 1600  WBC 17.6* 18.4* 17.6* 15.2*  NEUTROABS  --   --   --  13.1*  HGB 10.3* 9.7* 10.2* 9.8*  HCT 31.3* 29.8* 31.1* 30.1*  MCV 89.9 90.9 90.4 89.9  PLT 366 327 318 381   Basic Metabolic Panel: Recent Labs  Lab 01/10/17 0356 01/11/17 0510 01/12/17 0400 01/15/17 1600 01/16/17 0355  NA 139 138 136 137 137  K 3.6 3.3* 3.2* 3.1* 3.1*  CL 107 105 102 100* 99*  CO2 24 28 29 31 31   GLUCOSE 179* 186* 152* 177* 188*  BUN 8 7 6 17 17   CREATININE 0.61 0.61 0.52* 0.51* 0.48*    CALCIUM 8.0* 7.9* 7.7* 7.8* 7.8*  MG 1.7  --   --  1.7 1.8  PHOS  --   --   --  1.8*  --    GFR: Estimated Creatinine Clearance: 70 mL/min (A) (by C-G formula based on SCr of 0.48 mg/dL (L)).   Cardiac Enzymes: No results for input(s): CKTOTAL, CKMB, CKMBINDEX, TROPONINI in the last 168 hours. CBG: Recent Labs  Lab 01/15/17 2016 01/16/17 0018 01/16/17 0412 01/16/17 0756 01/16/17 1154  GLUCAP 187* 252* 181* 171* 135*    Recent Results (from the past 240 hour(s))  Surgical pcr screen     Status: None   Collection Time: 01/09/17 12:26 PM  Result Value Ref Range Status   MRSA, PCR NEGATIVE NEGATIVE Final   Staphylococcus aureus NEGATIVE NEGATIVE Final    Comment: (NOTE) The Xpert SA Assay (FDA approved for NASAL specimens in patients 63 years of age and older), is one component of a comprehensive surveillance program. It is not intended to diagnose infection nor to guide or monitor treatment.      Scheduled Meds: . enoxaparin (LOVENOX) injection  40 mg Subcutaneous Q24H  . feeding supplement (PRO-STAT SUGAR FREE 64)  30 mL Per Tube BID  . free water  100 mL Per Tube Q8H  . insulin aspart  0-15  Units Subcutaneous Q4H  . insulin glargine  5 Units Subcutaneous QHS  . pantoprazole sodium  40 mg Per Tube Daily  . potassium phosphate (monobasic)  500 mg Per Tube TID WC & HS   Continuous Infusions: . feeding supplement (OSMOLITE 1.5 CAL) 1,000 mL (01/14/17 1942)     LOS: 10 days    Author:  Berle Mull, MD [facility] Pager: [phone] 01/16/2017 4:08 PM     If 7PM-7AM, please contact night-coverage [email or web address] Password [email or web address] 01/16/2017, 4:08 PM

## 2017-01-16 NOTE — Progress Notes (Signed)
Alerted MD that the "surgeon" has not come to room to try to unclog G-Tube. Writer inserted protocol Creon and Na Bicarb tab as much as possible in small area of G-tube earlier today. Will await surgeon for further instruction.

## 2017-01-16 NOTE — Progress Notes (Signed)
MD on floor and aware of clogging of G-Tube. Abdominal xray ordered.

## 2017-01-16 NOTE — Progress Notes (Signed)
Patient ID: Cody Mckay, male   DOB: Mar 29, 1932, 83 y.o.   MRN: [id number]  [facility]  Asked to evaluate jejunostomy feeding tube for obstruction.  I was able to dislodge the obstruction with forceful irrigation of saline.  Tube is now open and tube feedings may be restarted.  If further problems, will need tube study in radiology with possible tube exchange.  Call if needed.  [place], [place] Office: [phone]

## 2017-01-17 ENCOUNTER — Telehealth: Payer: Self-pay

## 2017-01-17 DIAGNOSIS — K9413 Enterostomy malfunction: Secondary | ICD-10-CM

## 2017-01-17 LAB — BASIC METABOLIC PANEL
Anion gap: 9 (ref 5–15)
BUN: 14 mg/dL (ref 6–20)
CHLORIDE: 96 mmol/L — AB (ref 101–111)
CO2: 29 mmol/L (ref 22–32)
Calcium: 7.9 mg/dL — ABNORMAL LOW (ref 8.9–10.3)
Creatinine, Ser: 0.52 mg/dL — ABNORMAL LOW (ref 0.61–1.24)
GFR calc Af Amer: >60 mL/min (ref >60)
GFR calc non Af Amer: >60 mL/min (ref >60)
GLUCOSE: 255 mg/dL — AB (ref 65–99)
POTASSIUM: 3.7 mmol/L (ref 3.5–5.1)
Sodium: 134 mmol/L — ABNORMAL LOW (ref 135–145)

## 2017-01-17 LAB — GLUCOSE, CAPILLARY
GLUCOSE-CAPILLARY: 269 mg/dL — AB (ref 65–99)
GLUCOSE-CAPILLARY: 243 mg/dL — AB (ref 65–99)
GLUCOSE-CAPILLARY: 211 mg/dL — AB (ref 65–99)
Glucose-Capillary: 92 mg/dL (ref 65–99)

## 2017-01-17 LAB — MAGNESIUM: Magnesium: 1.7 mg/dL (ref 1.7–2.4)

## 2017-01-17 MED ORDER — ACETAMINOPHEN 160 MG/5ML PO SOLN
650.0000 mg | Freq: Four times a day (QID) | ORAL | Status: DC | PRN
Start: 2017-01-17 — End: 2017-01-17

## 2017-01-17 MED ORDER — LANTUS SOLOSTAR 100 UNIT/ML ~~LOC~~ SOPN: 15.0000 [IU] | PEN_INJECTOR | Freq: Every day | SUBCUTANEOUS | 11 refills | Status: AC

## 2017-01-17 MED ORDER — HEPARIN SOD (PORK) LOCK FLUSH 100 UNIT/ML IV SOLN
500.0000 [IU] | INTRAVENOUS | Status: AC | PRN
  Administered 2017-01-17: 500 [IU]

## 2017-01-17 MED ORDER — MORPHINE SULFATE (CONCENTRATE) 10 MG/0.5ML PO SOLN: 5.0000 mg | ORAL | 0 refills | Status: AC | PRN

## 2017-01-17 MED ORDER — HYDROCODONE-ACETAMINOPHEN 7.5-325 MG/15ML PO SOLN: 10.0000 mL | ORAL | 0 refills | Status: AC | PRN

## 2017-01-17 MED ORDER — PRO-STAT SUGAR FREE PO LIQD: 30.0000 mL | Freq: Two times a day (BID) | ORAL | 0 refills | Status: AC

## 2017-01-17 MED ORDER — BISACODYL 10 MG RE SUPP: 10.0000 mg | Freq: Every day | RECTAL | 0 refills | Status: AC | PRN

## 2017-01-17 MED ORDER — FREE WATER
100.0000 mL | Freq: Three times a day (TID) | 0 refills | Status: AC
Start: 2017-01-17 — End: ?

## 2017-01-17 MED ORDER — OSMOLITE 1.5 CAL PO LIQD: 1000.0000 mL | ORAL | 0 refills | Status: AC

## 2017-01-17 MED ORDER — MORPHINE SULFATE (CONCENTRATE) 10 MG/0.5ML PO SOLN: 5.0000 mg | ORAL | Status: DC | PRN

## 2017-01-17 MED ORDER — PANTOPRAZOLE SODIUM 40 MG PO PACK: 40.0000 mg | PACK | Freq: Every day | ORAL | 0 refills | Status: AC

## 2017-01-17 MED ORDER — ONDANSETRON 4 MG PO TBDP: 4.0000 mg | ORAL_TABLET | Freq: Four times a day (QID) | ORAL | 0 refills | Status: AC | PRN

## 2017-01-17 MED FILL — Trazodone HCl Tab 50 MG: ORAL | Qty: 1 | Status: AC

## 2017-01-17 NOTE — Discharge Instructions (Signed)
Avoid using the J tube for crushed medicine.  Percutaneous Endoscopic Jejunostomy, Care After This sheet gives you information about how to care for yourself after your procedure. Your health care provider may also give you more specific instructions. If you have problems or questions, contact your health care provider. What can I expect after the procedure? After the procedure, it is common to have:  Pain and tenderness around your feeding tube.  A sore throat.  Slight drainage around your feeding tube.  Follow these instructions at home: Activity  Return to your normal activities as told by your health care provider. Ask your health care provider what activities are safe for you.  Take short walks often. Try to take at least two 10-minute walks each day.  Do not drive for 24 hours if you were given a medicine to help you relax (sedative). Incision care  Do not shower for the first 2 days after the procedure.  Do not take a bath, swim, or use a hot tub for the first 2 weeks after the procedure.  Follow instructions from your health care provider about how to take care of your incision. Make sure you: ? Wash your hands with soap and water before you change your bandage (dressing). If soap and water are not available, use hand sanitizer. ? Change your dressing as told by your health care provider. ? Clean the skin around your feeding tube every day. You may need to use a certain kind of solution to do this. ? Apply any ointments to the area only as told by your health care provider.  Check your incision area every day for signs of infection. Check for: ? More redness, swelling, or pain. ? More fluid or blood. ? Warmth. ? Pus or a bad smell. General instructions  Take over-the-counter and prescription medicines only as told by your health care provider.  Make sure you know when and how to start tube feeding. Contact your health care provider if you have any questions.  Keep  all follow-up visits as told by your health care provider. This is important. Contact a health care provider if:  You have pain that gets worse or does not get better with medicine.  You have constipation or diarrhea that does not improve with treatment.  You feel bloated.  You feel nauseous.  You have more redness, swelling, or pain around your incision.  You have more fluid or blood coming from your incision.  Your incision feels warm to the touch.  You have pus or a bad smell coming from your incision.  You have a fever. Get help right away if:  You have a fever that lasts longer than 3 days.  You have severe pain.  You have coughing and trouble breathing.  You vomit blood.  Your feeding tube is blocked, you cannot rinse out (flush out) the tube, or it comes out.  You become disoriented, confused, or very tired. This information is not intended to replace advice given to you by your health care provider. Make sure you discuss any questions you have with your health care provider. Document Released: 01/23/2013 Document Revised: 10/31/2015 Document Reviewed: 10/31/2015 Elsevier Interactive Patient Education  [employer].

## 2017-01-17 NOTE — Telephone Encounter (Signed)
appts moved from [name] to dr Burr Medico per dr Burr Medico 12/17 inbasket message.  Pt is the the hosp at this time and will get new appts at d/c   Cody Mckay

## 2017-01-17 NOTE — Progress Notes (Signed)
OT Cancellation Note  Patient Details Name: NARESH ALTHAUS MRN: [id number] DOB: 04-30-1932   Cancelled Treatment:    Reason Eval/Treat Not Completed: Fatigue/lethargy limiting ability to participate  Pt sleeping soundly. RN reported pt had been fussy and had multiple BM's. Will check on pt next day Kari Baars, [place]  Payton Mccallum D 01/17/2017, 11:50 AM

## 2017-01-17 NOTE — Progress Notes (Signed)
Pts port deaccessed by IV Team. Pt denies pain at the time of d/c. Pts belongings and d/d summary given to wife and caregiver at the bed side. Pt transported home with PTAR

## 2017-01-17 NOTE — Progress Notes (Signed)
This shift Gtube unclogged by MD. Will restart per order and continue to monitor.

## 2017-01-18 ENCOUNTER — Telehealth: Payer: Self-pay | Admitting: Family Medicine

## 2017-01-18 ENCOUNTER — Ambulatory Visit: Payer: Medicare Other | Admitting: Nurse Practitioner

## 2017-01-18 ENCOUNTER — Telehealth: Payer: Self-pay | Admitting: *Deleted

## 2017-01-18 MED ORDER — INSULIN PEN NEEDLE 32G X 4 MM MISC: 12 refills | Status: DC

## 2017-01-18 MED ORDER — INSULIN PEN NEEDLE 32G X 4 MM MISC: 12 refills | Status: AC

## 2017-01-18 NOTE — Telephone Encounter (Signed)
Sent to the pharmacy by e-scribe. 

## 2017-01-18 NOTE — Telephone Encounter (Signed)
Received call from [place], [place] @ [facility] requesting orders for wound care to pressure sore on pt's sacral area.   Spoke with [place], and informed her to fax orders that are appropriate for pt to our office for Dr. Burr Medico to review and to sign. Holly voiced understanding. Gave Holly direct fax number to nurses' desk. Holly's    Phone     [phone].

## 2017-01-18 NOTE — Discharge Summary (Signed)
[facility] Discharge Summary   Patient: Cody Mckay YTK:[id number]   PCP: Dorothyann Peng, NP DOB: [birth year]   Date of admission: 01/06/2017   Date of discharge: 01/17/2017     Discharge Diagnoses:  Active Problems:   Pancreatic adenocarcinoma (HCC)   Dehydration   Generalized weakness   Severe protein-calorie malnutrition (HCC)   Bradycardia   Orthostatic dizziness   Encounter for palliative care   Pressure injury of skin   Admitted From: home Disposition:  Home with home health and care connection with hospice  Recommendations for Outpatient Follow-up:  1. Please follow-up with PCP in 1 week 2. If no improvement in patient condition consider transition to hospice as an outpatient  [place] Follow up.   Specialty:  Home Health Services Why:  Home Health RN, Physical Therapy, Occupational Therapy, Social Worker, and aide. agency will call to arrange initial visit. and delivery of tube feedings Contact information: [place] [address] [phone]        Dorothyann Peng, NP. Schedule an appointment as soon as possible for a visit in 1 week(s).   Specialty:  Family Medicine Contact information: [place] [address] [phone]        Johnathan Hausen, MD. Call.   Specialty:  General Surgery Why:  as needed.  Contact information: [address] [place] [address] [phone]          Diet recommendation: N.p.o., on tube feeding  Activity: The patient is advised to gradually reintroduce usual activities.  Discharge Condition: good  Code Status: DNR/DNI  History of present illness: As per the H and P dictated on admission, "Cody Mckay is a 83 y.o. male with medical history significant of adenocarcinoma of the pancreas on chemotherapy, diabetes type 2, hypertension, hyperlipidemia, GERD comes to the hospital for evaluation of dizziness.   Patient states he was diagnosed of pancreatic cancer about 1-2 months ago in [place].  He recently moved here and has been following with Dr. Annamaria Boots.  He is currently on chemo therapy Gemcitabine and Abraxane (3weeks on one week off), but in past few days he has had very poor appetite and fluid intake.  Due to this he has been to ER and Dr. Fritz Pickerel office almost every day for last 3 days for IV fluids and potassium repletion.  He comes to the ER today with the same complaints and has been feeling dizzy at home as well. In the ER today he appears clinically dehydrated with poor desire to eat.  We will admit him for further care.  Patient comprehends really well and his family is at bedside as well.  I have discussed it at great length that patient's options are limited given the nature of pancreatic cancer.  Patient is desires to be DO NOT RESUSCITATE but would like to speak with Dr Annamaria Boots and the morning about his long term prognosis from his cancer perspective, feeding options including J tube and are open to speaking with Palliative care services to establish long term goals of care.   6"  Hospital Course:  Summary of his active problems in the hospital is as following. #Severe protein calorie malnutrition, dehydration due to poor intake in the setting of pancreatic malignancy: -Evaluated by oncologist and general surgery.  Status post jejunostomy feeding tube placement by general surgery on 12/10.   -continue tube feeding currently at goal and continue hydration.  -Patient would  continue to be strict n.p.o. per general surgery.   -Some leakage around the J-tube, discussed with general surgery recommend outpatient follow-up. -palliative care consulted; looking to go home with [facility] services and follow clinical response; most likely down the road home with hospice.  #Pancreatic cancer, adenocarcinoma in head of pancreas: Unresectable.  There is partial to complete duodenal obstruction secondary to  tumor.   -As per oncology, it was reviewed with GI Dr. Ardis Hughs, who doesn't think this is feasible for endoscopic stenting. -Continue supportive care and symptom management. -palliative care consulted, appreciate [place] discussion. -will continue PRN analgesics and pursuit [place] services. depending how progression or decline would benefit of hospice follow up. -patient to continue follow up with Dr. Burr Medico (next appointment 12/20 eighth/18).  #Dizziness likely contributed by orthostatic hypotension and bradycardia: Received IV fluid bolus and hydration with improvement in symptoms.   -CT scan of head with no acute finding.   -Echo unremarkable.   -no abnormalities seen on telemetry; telemetry discontinued  -seen by PT/OT and demonstrated good balance and no complaints with ambulation. Will discharge home with HHPT/HHOT.  #Type 2 diabetes:  -Continue sliding scale.   -Monitor blood sugar level intermittently -Anticipating some elevation in his blood sugar levels with tube feedings. -will add low dose lantus.  #Hypertension:  -Stable and well-controlled -No antihypertensive regimen has been required prior to admission.  #GERD:  -Continue PPI -Patient was given IV PPI in the hospital on discharge will switch to suspension.  # Hypokalemia:  -Repleted -Patient also on tube feedings now.    #Persistent leukocytosis likely due to demargination, dehydration and malignancy:  -No signs of UTI or pneumonia on workup -Patient is afebrile  -Continue supportive care.   #hyperlipidemia -Will discontinue niacin and Lipitor at this moment -Currently unable to take medications with current available route   J-tube dysfunction. Patient's J-tube appears to be clogged, discussed with general surgery, patient was given enzymatic unclogging and tube feedings were restarted.  At the time of the discharge patient was at goal of 60 cc/h.  All other chronic medical condition were stable during the  hospitalization.  Patient was seen by physical therapy, who recommended SNF, but patient wanted to go home therefore home health was arranged by Education officer, museum and case Freight forwarder. On the day of the discharge the patient's vitals were stable, and no other acute medical condition were reported by patient. the patient was felt safe to be discharge at home with home health.  Procedures and Results:  J-tube placement  Consultations:  Oncology  General surgery  Palliative care  DISCHARGE MEDICATION: Allergies as of 01/17/2017      Reactions   Codeine Other (See Comments)   Schizophrenic reactions.      Medication List    STOP taking these medications   amLODipine 5 MG tablet Commonly known as:  NORVASC   atorvastatin 10 MG tablet Commonly known as:  LIPITOR   CALCIUM 500 PO   diphenoxylate-atropine 2.5-0.025 MG tablet Commonly known as:  LOMOTIL   esomeprazole 40 MG capsule Commonly known as:  NEXIUM   NIASPAN 750 MG CR tablet Generic drug:  niacin   ondansetron 8 MG tablet Commonly known as:  ZOFRAN   potassium chloride SA 20 MEQ tablet Commonly known as:  K-DUR,KLOR-CON   prochlorperazine 10 MG tablet Commonly known as:  COMPAZINE   pyridOXINE 100 MG tablet Commonly known as:  VITAMIN B-6     TAKE these medications   acetaminophen 500 MG tablet Commonly known  as:  TYLENOL Take 500 mg by mouth every 6 (six) hours as needed.   alfuzosin 10 MG 24 hr tablet Commonly known as:  UROXATRAL   bisacodyl 10 MG suppository Commonly known as:  DULCOLAX Place 1 suppository (10 mg total) rectally daily as needed for moderate constipation.   feeding supplement (OSMOLITE 1.5 CAL) Liqd Place 1,000 mLs into feeding tube continuous.   feeding supplement (PRO-STAT SUGAR FREE 64) Liqd Place 30 mLs into feeding tube 2 (two) times daily.   free water Soln Place 100 mLs into feeding tube every 8 (eight) hours.   glucose blood test strip Commonly known as:  ONETOUCH  VERIO Use as instructed   HYDROcodone-acetaminophen 7.5-325 mg/15 ml solution Commonly known as:  HYCET Take 10-20 mLs by mouth every 4 (four) hours as needed for moderate pain.   insulin aspart 100 UNIT/ML injection Commonly known as:  novoLOG Inject 5 Units into the skin 3 (three) times daily before meals.   INSULIN SYRINGE 1CC/30GX5/16" 30G X 5/16" 1 ML Misc .   LANTUS SOLOSTAR 100 UNIT/ML Solostar Pen Generic drug:  Insulin Glargine Inject 15 Units into the skin daily.   lidocaine-prilocaine cream Commonly known as:  EMLA Apply 1 application topically as needed.   morphine CONCENTRATE 10 MG/0.5ML Soln concentrated solution Place 0.25 mLs (5 mg total) into feeding tube every 4 (four) hours as needed for severe pain.   ondansetron 4 MG disintegrating tablet Commonly known as:  ZOFRAN-ODT Take 1 tablet (4 mg total) by mouth every 6 (six) hours as needed for nausea.   onetouch ultrasoft lancets USE TO TEST BLOOD GLUCOSE THREE TIMES DAILY   pantoprazole sodium 40 mg/20 mL Pack Commonly known as:  PROTONIX Place 20 mLs (40 mg total) into feeding tube daily.   triamcinolone ointment 0.1 % Commonly known as:  KENALOG as needed.      Allergies  Allergen Reactions  . Codeine Other (See Comments)    Schizophrenic reactions.   Discharge Instructions    Diet NPO time specified   Complete by:  As directed    Discharge instructions   Complete by:  As directed    It is important that you read following instructions as well as go over your medication list with RN to help you understand your care after this hospitalization.  Discharge Instructions: Please follow-up with PCP in one week  Please request your primary care physician to go over all Hospital Tests and Procedure/Radiological results at the follow up,  Please get all Hospital records sent to your PCP by signing hospital release before you go home.   Do not take more than prescribed Pain, Sleep and Anxiety  Medications. You were cared for by a hospitalist during your hospital stay. If you have any questions about your discharge medications or the care you received while you were in the hospital after you are discharged, you can call the unit and ask to speak with the hospitalist on call if the hospitalist that took care of you is not available.  Once you are discharged, your primary care physician will handle any further medical issues. Please note that NO REFILLS for any discharge medications will be authorized once you are discharged, as it is imperative that you return to your primary care physician (or establish a relationship with a primary care physician if you do not have one) for your aftercare needs so that they can reassess your need for medications and monitor your lab values. You Must read complete instructions/literature  along with all the possible adverse reactions/side effects for all the Medicines you take and that have been prescribed to you. Take any new Medicines after you have completely understood and accept all the possible adverse reactions/side effects. Wear Seat belts while driving. If you have smoked or chewed Tobacco in the last 2 yrs please stop smoking and/or stop any Recreational drug use.   Increase activity slowly   Complete by:  As directed      Discharge Exam: Filed Weights   01/15/17 0431 01/16/17 0650 01/17/17 0444  Weight: 68.9 kg (152 lb) 69.5 kg (153 lb 3.5 oz) 65.5 kg (144 lb 6.4 oz)   Vitals:   01/16/17 2115 01/17/17 0444  BP: (!) 155/86 (!) 145/76  Pulse: 85 92  Resp: 20 20  Temp: 98.3 F (36.8 C) 98.6 F (37 C)  SpO2: 96% 95%   General: Appear in mild distress, no Rash; Oral Mucosa moist. Cardiovascular: S1 and S2 Present, no Murmur, no JVD Respiratory: Bilateral Air entry present and basal Crackles, no wheezes Abdomen: Bowel Sound present, Soft and mild tenderness Extremities: no Pedal edema, no calf tenderness Neurology: Grossly no focal neuro  deficit.  6  The results of significant diagnostics from this hospitalization (including imaging, microbiology, ancillary and laboratory) are listed below for reference.    Significant Diagnostic Studies: Dg Chest 1 View  Result Date: 12/23/2016 CLINICAL DATA:  Port placement EXAM: CHEST  1 VIEW COMPARISON:  None. FINDINGS: The tip of the right jugular Port-A-Cath projects over the cavoatrial junction based on a single PA view. There is no lateral view of the chest for more accurate confirmation. No pneumothorax. No pleural effusion. Normal heart size. No consolidation or lung mass. IMPRESSION: Right jugular Port-A-Cath tip projects over the cavoatrial junction. No active cardiopulmonary disease. Electronically Signed   By: Marybelle Killings M.D.   On: 12/23/2016 11:07   Dg Chest 2 View  Result Date: 01/03/2017 CLINICAL DATA:  Generalize weakness for the past week with multiple episodes of vomiting over the past 3 days. Fell early this morning. Possible dehydration. EXAM: CHEST  2 VIEW COMPARISON:  Portable chest x-ray of December 22, 2016 FINDINGS: The lungs are mildly hyperinflated but clear. There is no pleural effusion or pneumothorax. The heart and pulmonary vascularity are normal. The mediastinum is normal in width. There is multilevel degenerative disc disease of the thoracic spine. The power port catheter tip projects over the distal third of the SVC. IMPRESSION: Chronic bronchitic changes, stable. No pneumonia, CHF, nor other acute cardiopulmonary abnormality. Electronically Signed   By: David  Martinique M.D.   On: 01/03/2017 13:43   Ct Head Wo Contrast  Result Date: 01/08/2017 CLINICAL DATA:  Episodic vertigo. Pancreatic cancer being treated with chemotherapy. Diabetes and hypertension. EXAM: CT HEAD WITHOUT CONTRAST TECHNIQUE: Contiguous axial images were obtained from the base of the skull through the vertex without intravenous contrast. COMPARISON:  None. FINDINGS: Brain: Generalized brain  atrophy. Old small vessel cerebellar infarctions. Chronic small-vessel ischemic changes of the thalami, basal ganglia and hemispheric white matter. No cortical or large vessel territory infarction. No mass lesion, hemorrhage, hydrocephalus or extra-axial collection. Vascular: There is atherosclerotic calcification of the major vessels at the base of the brain. Skull: Normal Sinuses/Orbits: Clear/normal Other: Benign choroid calcification and pineal calcification. IMPRESSION: No acute finding by CT. Generalized atrophy. Chronic small-vessel ischemic changes affecting the cerebellum an the cerebral hemispheres as outlined above. Electronically Signed   By: Nelson Chimes M.D.   On: 01/08/2017 10:53  Ct Abdomen Pelvis W Contrast  Result Date: 01/05/2017 CLINICAL DATA:  83 year old male with history of pancreatic carcinoma, with diagnosis [place]. Given history of oncology with Dr. Truitt Merle, and CT from outside institutions. EXAM: CT ABDOMEN AND PELVIS WITH CONTRAST TECHNIQUE: Multidetector CT imaging of the abdomen and pelvis was performed using the standard protocol following bolus administration of intravenous contrast. CONTRAST:  167mL ISOVUE-300 IOPAMIDOL (ISOVUE-300) INJECTION 61% COMPARISON:  No comparison available FINDINGS: Lower chest: No acute abnormality. Hepatobiliary: Relatively uniform attenuation of the liver parenchyma. There is a small hypodense focus adjacent to the falciform ligament which measures 12 mm on axial CT images, best seen on the delayed images. Unremarkable appearance of gallbladder. Mild dilation of the proximal intrahepatic biliary ducts and the common bile duct. The common bile duct abruptly terminates within the head of the pancreas, near the cut off sign of the pancreatic duct at the pancreatic head. Pancreas: Hypoenhancing mass involving the uncinate process and pancreatic head. The axial measurements estimate 5.1 cm x 3.6 cm, just anterior to the transverse duodenum. The  mass is inseparable from the proximal superior mesenteric artery, with obliteration of the fat planes. The inferior/ posterior pancreatic mass inseparable from the duodenum. The right aspects of the mass extend towards the pancreatic head where there is a cut off of the pancreatic duct and the common bile duct. Distal pancreatic duct is dilated, measuring as low grade as 4 mm in diameter. No comparison CT imaging available. Small lymph nodes in the gastro pathic ligament. Borderline enlarged portacaval node measuring 8 mm. Rounded soft tissue nodule measures 11 mm just inferior to the pancreatic neck. Portal vein, splenic vein, superior mesenteric vein, and inferior mesenteric vein patent on the study. Spleen: Unremarkable spleen Adrenals/Urinary Tract: Unremarkable adrenal glands. Bilateral kidneys unremarkable with hydronephrosis. Small nonobstructive stone at the superior collecting system of the right kidney. Unremarkable course the visualized bilateral ureters. Urinary bladder with demonstrate circumferential thickening. Stomach/Bowel: Unremarkable stomach. Duodenum is attenuated at the transverse portion, which is inseparable from pancreatic mass. Proximal small bowel unremarkable with no distention. Enteric contrast reaches distal small bowel. Normal appendix. Colonic diverticula. No evidence of associated inflammatory changes. Vascular/Lymphatic: Vascular calcifications of the aorta extending into the bilateral iliac system. Bilateral iliac and proximal femoral vasculature patent. Calcifications of the proximal bilateral renal arteries, with likely 50% stenosis bilaterally. Vascular calcifications at the origin of the celiac artery, superior mesenteric artery, and inferior mesenteric artery. Reproductive: Transverse diameter of the prostate estimated at 6.2 cm. Other: Fat containing umbilical hernia Musculoskeletal: No acute displaced fracture. Degenerative changes of the lumbar spine. IMPRESSION:  Hypoenhancing mass of the uncinate process, compatible with adenocarcinoma. CT is positive for vascular involvement of the proximal superior mesenteric artery at the posterior margin. Comparison with outside CT imaging may be useful. Pancreatic mass is inseparable from the transverse duodenum, with narrowing of the lumen of the transverse duodenum. At least stenosis if not occlusion of the pancreatic duct, with questionable obstruction of the common bile duct. There is associated mild intrahepatic biliary ductal dilatation. Again, comparison with outside CT imaging may be useful to evaluate for progression. Question of small metastatic implant of the left liver adjacent to falciform ligament. Alternatively this could represent focal fat. MRI may be considered, if warranted. Aortic atherosclerosis, mesenteric atherosclerosis, and bilateral renal artery atherosclerosis. Aortic Atherosclerosis (ICD10-I70.0). Electronically Signed   By: Corrie Mckusick D.O.   On: 01/05/2017 16:10   Dg Abd 2 Views  Result Date: 01/03/2017 CLINICAL DATA:  Lies weakness for the past week with multiple episodes of vomiting over the past 3 days. Patient ports falling in the bathtub early this morning. Possible dehydration. EXAM: ABDOMEN - 2 VIEW COMPARISON:  None in PACs FINDINGS: The lung bases are clear. The bowel gas pattern is normal. There are exuberant splenic artery calcifications. There is calcification in the wall of the abdominal aorta. There are phleboliths within the pelvis. The observed portions of the lumbar spine, pelvis, and hips exhibit no acute abnormalities. IMPRESSION: No acute intra-abdominal abnormality is observed. Electronically Signed   By: David  Martinique M.D.   On: 01/03/2017 13:41   Dg Abd Portable 1v  Result Date: 01/16/2017 CLINICAL DATA:  Jejunostomy tube leakage EXAM: PORTABLE ABDOMEN - 1 VIEW COMPARISON:  None. FINDINGS: Scattered large and small bowel gas is noted. A jejunostomy tube is noted in the  abdomen. No obstructive changes are seen. Vascular calcifications are noted. No acute bony abnormality is seen. IMPRESSION: Jejunostomy tube in the abdomen. No contrast was injected on this exam. Electronically Signed   By: Inez Catalina M.D.   On: 01/16/2017 14:36    Microbiology: Recent Results (from the past 240 hour(s))  Surgical pcr screen     Status: None   Collection Time: 01/09/17 12:26 PM  Result Value Ref Range Status   MRSA, PCR NEGATIVE NEGATIVE Final   Staphylococcus aureus NEGATIVE NEGATIVE Final    Comment: (NOTE) The Xpert SA Assay (FDA approved for NASAL specimens in patients 21 years of age and older), is one component of a comprehensive surveillance program. It is not intended to diagnose infection nor to guide or monitor treatment.      Labs: CBC: Recent Labs  Lab 01/12/17 0400 01/15/17 1600  WBC 17.6* 15.2*  NEUTROABS  --  13.1*  HGB 10.2* 9.8*  HCT 31.1* 30.1*  MCV 90.4 89.9  PLT 318 270   Basic Metabolic Panel: Recent Labs  Lab 01/12/17 0400 01/15/17 1600 01/16/17 0355 01/17/17 0325  NA 136 137 137 134*  K 3.2* 3.1* 3.1* 3.7  CL 102 100* 99* 96*  CO2 29 31 31 29   GLUCOSE 152* 177* 188* 255*  BUN 6 17 17 14   CREATININE 0.52* 0.51* 0.48* 0.52*  CALCIUM 7.7* 7.8* 7.8* 7.9*  MG  --  1.7 1.8 1.7  PHOS  --  1.8*  --   --    Liver Function Tests: Recent Labs  Lab 01/15/17 1600  AST 49*  ALT 34  ALKPHOS 68  BILITOT 0.6  PROT 5.3*  ALBUMIN 2.0*   No results for input(s): LIPASE, AMYLASE in the last 168 hours. No results for input(s): AMMONIA in the last 168 hours. Cardiac Enzymes: No results for input(s): CKTOTAL, CKMB, CKMBINDEX, TROPONINI in the last 168 hours. BNP (last 3 results) No results for input(s): BNP in the last 8760 hours. CBG: Recent Labs  Lab 01/16/17 2031 01/17/17 0004 01/17/17 0440 01/17/17 0849 01/17/17 1153  GLUCAP 129* 92 269* 243* 211*   Time spent: 35 minutes  Signed:  Berle Mull  [facility] 01/17/2017   , 9:09 AM

## 2017-01-18 NOTE — Telephone Encounter (Signed)
I tried to reach pt and phone number is not accepting any calls at this time, will try again tomorrow.

## 2017-01-18 NOTE — Telephone Encounter (Signed)
Copied from [place] [id number]. Topic: General - Other >> Jan 18, 2017 12:26 PM Carolyn Stare wrote:  Pt daughter call to say pt keeps getting the wrong RX    Pt need Nana Ultra Fine Needles     he needs needles for the pens     [facility]

## 2017-01-19 ENCOUNTER — Telehealth: Payer: Self-pay | Admitting: Medical Oncology

## 2017-01-19 ENCOUNTER — Telehealth: Payer: Self-pay

## 2017-01-19 NOTE — Telephone Encounter (Addendum)
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  J-tube placed (14 red robinson catheter) on 12/10 during admission for nausea, vomiting, dehydration. Past medical history of DM, HTN, GERD.  Per Dr. Burr Medico note patient not ready for hospice at this time and may be candidate for further chemotherapy.    Spoke with daughter Malachy Mood this am via phone and introduced self and service.  Malachy Mood reports that she is an Therapist, sports.  Patient is currently tolerating osmolite 1.5 19ml/hr over 24 hours with promod 58ml BID.  Reports that patient was having issues with diarrhea 6-8 times per day since coming out of the hospital but this has resolved.  Reports may have had 1 bowel movement last night during the night. Daughter is trying to get at least 639ml additional water flush in patient during the day.  Daughter also flushing tube with promod, before and after starting feeding and with medications (hydrocodone, morphine).  Reports patient is urinating frequently and no dehydrated.  No issues with nausea.     Medications: aspart, lantus, morphine  Labs: reviewed  Anthropometrics:   Height: 70 inches Weight: 144 lb, noted weight on 12/5 153 lb BMI: 20   Estimated Energy Needs  Kcals: 1914-7829 calories/d Protein: 98-113 g/d Fluid: > 1.9 L/d  NUTRITION DIAGNOSIS: Inadequate oral intake related to cancer as evidenced by placement of J-tube for nutrition   INTERVENTION:   Continue with osmolite 1.5 at 81ml/hr for 24 hours with promod 19ml BID with water flush. Clarified water flush should be at least 238ml 4-5 times per day to provide additional 800-1034ml of water.  Spoke with Maudie Mercury, [facility] with [facility] regarding coordination of care.   Tube feeding regimen to provide 2360 calories, 110 g of protein and 1894-2058ml free water.   Questioned daughter if RD  could meet with patient on 12/28 at next MD visit and daughter reports unsure if they will come if patient is not strong enough.  Daughter reports she if very realistic and prefers hospice but honoring patients wishes at this time.  Agreeable to RD phone follow-up. Malachy Mood requested RD call her at [phone] for phone follow-up.    MONITORING, EVALUATION, GOAL: Patient to tolerate tube feeding and maintain nutrition.   NEXT VISIT: as needed  Cody Mckay B. Zenia Resides, [place], [place] Registered Dietitian [phone] (pager)

## 2017-01-19 NOTE — Telephone Encounter (Signed)
err

## 2017-01-19 NOTE — Telephone Encounter (Signed)
Transition Care Management Follow-up Telephone Call  Patient: Cody Mckay TUU:[id number]   PCP: Dorothyann Peng, NP DOB: 1932-12-06   Date of admission: 01/06/2017             Date of discharge: 01/17/2017     Discharge Diagnoses:  Active Problems:   Pancreatic adenocarcinoma (HCC)   Dehydration   Generalized weakness   Severe protein-calorie malnutrition (HCC)   Bradycardia   Orthostatic dizziness   Encounter for palliative care   Pressure injury of skin   Admitted From: home Disposition:  Home with home health and care connection with hospice  --       How have you been since you were released from the hospital? "okay"   Do you understand why you were in the hospital? yes   Do you understand the discharge instructions? yes   Where were you discharged to? Home  Items Reviewed:  Medications reviewed: yes  Allergies reviewed: yes  Dietary changes reviewed: yes  Referrals reviewed: yes   Functional Questionnaire:   Activities of Daily Living (ADLs):   He states they are independent in the following: ambulation, bathing and hygiene, feeding, continence, grooming, toileting and dressing States they require assistance with the following: none   Any transportation issues/concerns?: no   Any patient concerns? no   Confirmed importance and date/time of follow-up visits scheduled no  Provider Appointment booked with, Patient declined to schedule appointment at this time, he has several upcoming appointments, will call office back to schedule with Dorothyann Peng  Confirmed with patient if condition begins to worsen call PCP or go to the ER.  Patient was given the office number and encouraged to call back with question or concerns.  : yes

## 2017-01-20 ENCOUNTER — Telehealth: Payer: Self-pay | Admitting: *Deleted

## 2017-01-20 NOTE — Telephone Encounter (Signed)
Called Donnita RN/[facility] & encouraged to watch BS & if consistently elevated Dr Burr Medico will increase Lantus insulin.  Order give for OK to apply hydrocoloid dsg.   Called [facility] & they had to order protonix & should be there today.   Called Hospice & discussed whether they would take pt getting TF.  Was informed that this would be discussed with pt at visit & they would not admit if getting TF.   Tried to call pt but no answer.  Left message on wife's mobile to call back to discuss TF & to find out if pt definitely wanted Hospice.

## 2017-01-20 NOTE — Telephone Encounter (Signed)
Received call from [facility] stating that pt's daughter called & states pt is ready for hospice.  Amy asked if appropriate & would Dr Burr Medico be attending.  Message to Dr Burr Medico.

## 2017-01-20 NOTE — Telephone Encounter (Signed)
Received vm call from [place] stating that pt's blood sugar reading this am was 366.  He is on osmalite 1.5 tube feeding.  She also states pt needs protonix called to [facility].  She states that he has had a low grade fever of 100.2 last two nights & has an open area on his coccyx.  They would like an order to apply hydrocoloid.  They would wash with soap & water & apply skin prep to surrounding area & change every 3-5 days or as needed.  She can be reached at [phone].  Message to Dr Burr Medico.

## 2017-01-21 ENCOUNTER — Telehealth: Payer: Self-pay | Admitting: Adult Health

## 2017-01-21 NOTE — Telephone Encounter (Signed)
Copied from [place]. Topic: General - Other >> Jan 21, 2017  8:36 AM Cecelia Byars, NT wrote: Reason for CRM: Wife and daughter called and said patient was discharged on Monday from hospital and needs hydrocodone and tylenol 7.5-.325/15 thru feeding tube [facility] consult from hospice ,these were given at discharge please call [phone]

## 2017-01-21 NOTE — Telephone Encounter (Signed)
I left a voice message for a return call. A script was sent on 01/17/2017, would like to find out if anything else is needed.

## 2017-01-21 NOTE — Telephone Encounter (Signed)
I spoke with Cody Mckay and pt is under Hospice care, they are taking over the below order. She will call us she needs anything further.

## 2017-01-21 NOTE — Telephone Encounter (Signed)
I spoke with [facility] and Hospice just arrived there, most like they will be taking over care. The script that was recently sent in is all gone, it was a very small bottle. Malachy Mood will call us back today and anything further is needed.

## 2017-01-21 NOTE — Telephone Encounter (Signed)
Called & spoke with Evette/[facility]/[place] & gave approval/order for Hospice referral.  CLARIFICATION:  They can accept pt into hospice with TF but they will not cover the cost.  Dr Burr Medico will be attending & she likes symptom management by Hospice Doc.

## 2017-01-26 ENCOUNTER — Ambulatory Visit: Payer: Medicare Other | Admitting: Nurse Practitioner

## 2017-01-26 ENCOUNTER — Other Ambulatory Visit: Payer: Medicare Other

## 2017-01-27 ENCOUNTER — Telehealth: Payer: Self-pay | Admitting: *Deleted

## 2017-01-27 NOTE — Telephone Encounter (Signed)
FYI Daughter Charline Bills called.  "We're receiving letters about no show.  Dad is under Hospice.  HE won't be making any more appointments.

## 2017-01-28 ENCOUNTER — Ambulatory Visit: Payer: Medicare Other | Admitting: Hematology

## 2017-01-28 ENCOUNTER — Other Ambulatory Visit: Payer: Medicare Other

## 2017-01-31 ENCOUNTER — Telehealth: Payer: Self-pay | Admitting: *Deleted

## 2017-01-31 NOTE — Telephone Encounter (Signed)
"  Calling to let Dr. Burr Medico know this patient expired Jan 30, 2017 at 10:01 pm."

## 2017-02-01 DEATH — deceased

## 2017-02-19 ENCOUNTER — Other Ambulatory Visit: Payer: Self-pay | Admitting: Nurse Practitioner

## 2018-11-18 IMAGING — CT CT HEAD W/O CM
3 series · 15 of 45 positions shown, 18 images · non-contrast
Comparison: None.

CLINICAL DATA: Episodic vertigo. Pancreatic cancer being treated
with chemotherapy. Diabetes and hypertension.

EXAM:
CT HEAD WITHOUT CONTRAST
TECHNIQUE: Contiguous axial images were obtained from the base of the skull
through the vertex without intravenous contrast.

[Series 2: head wo · axial · 0.47mm/px · z∈[-127,-12]mm · 9 of 28 slices shown, 12 images]
[im 3/28  brain]
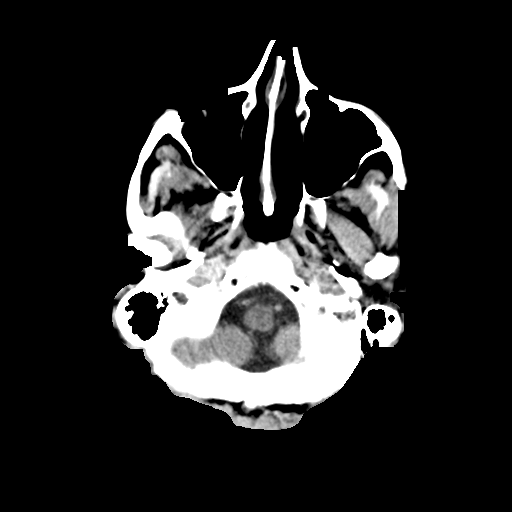
[im 3/28  bone]
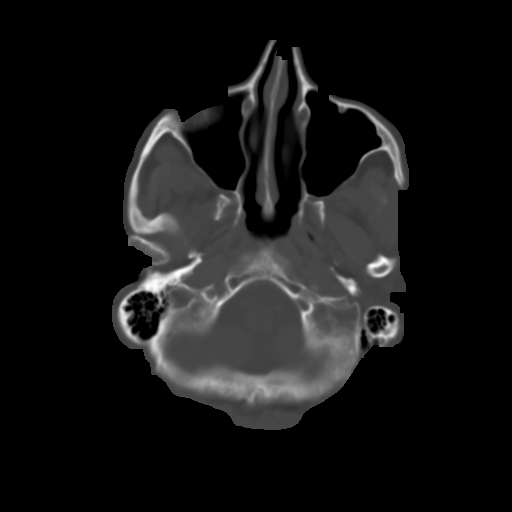
[im 6/28  brain]
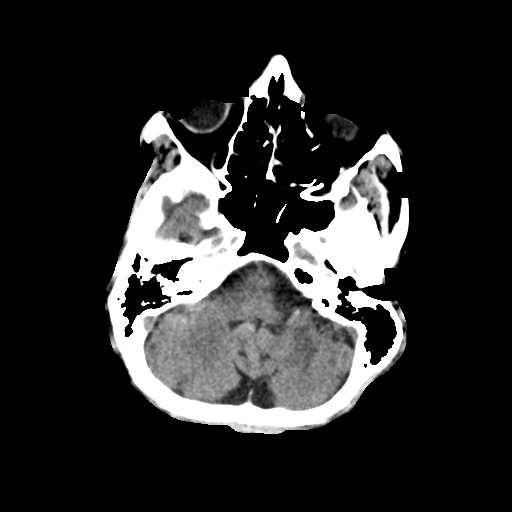
[im 9/28  brain]
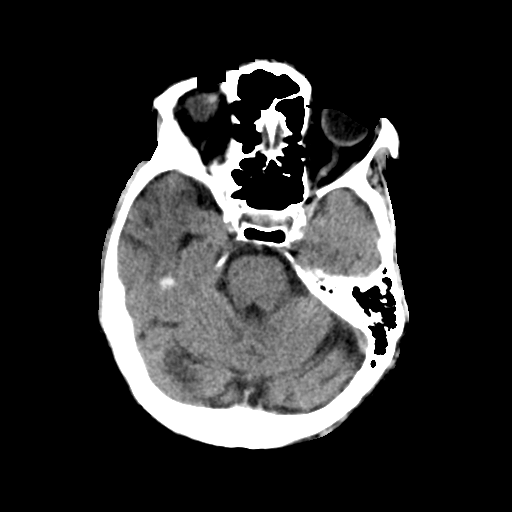
[im 12/28  brain]
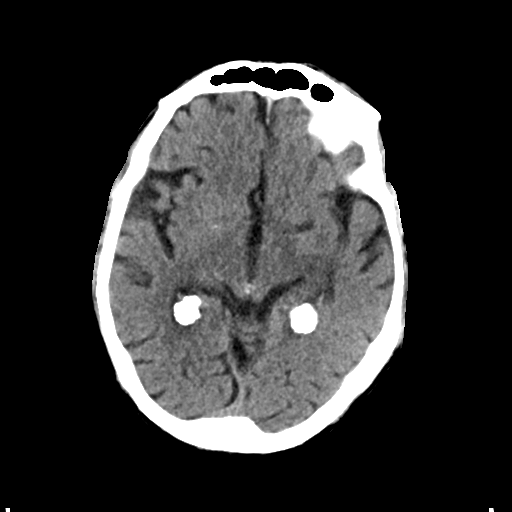
[im 15/28  brain]
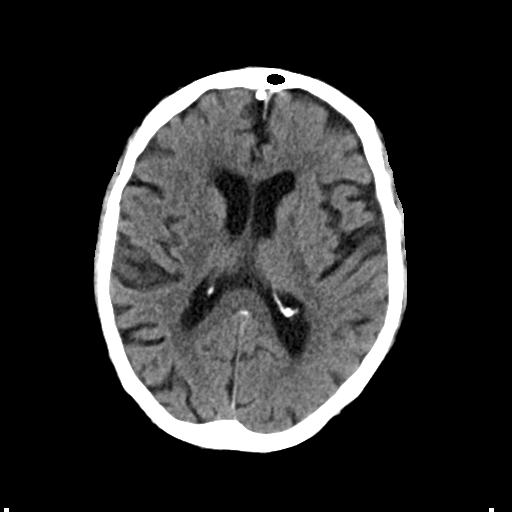
[im 15/28  bone]
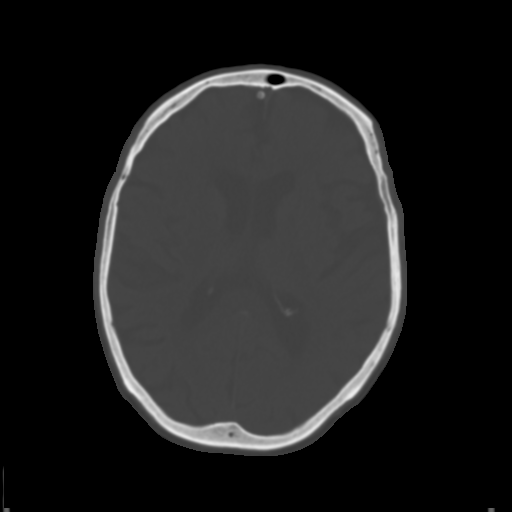
[im 17/28  brain]
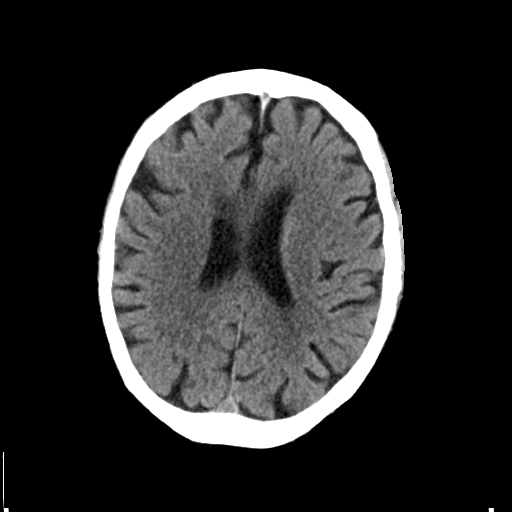
[im 20/28  brain]
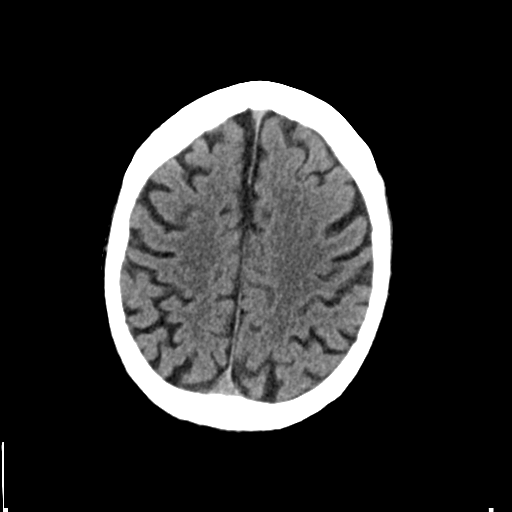
[im 23/28  brain]
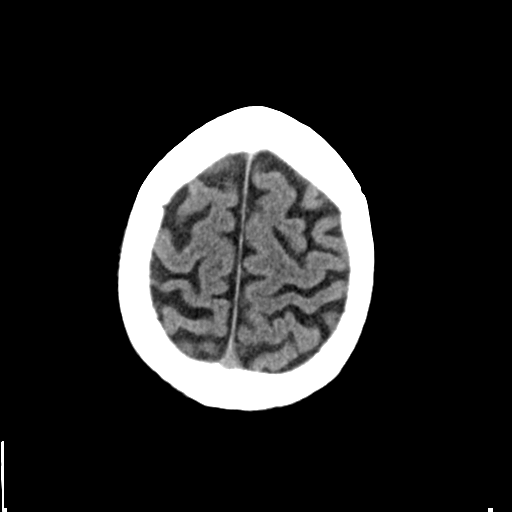
[im 26/28  brain]
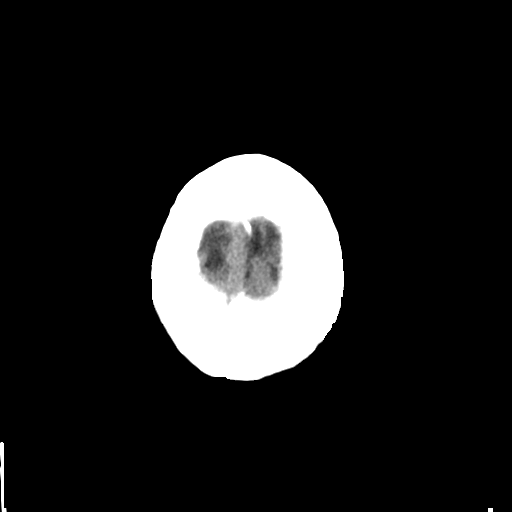
[im 26/28  bone]
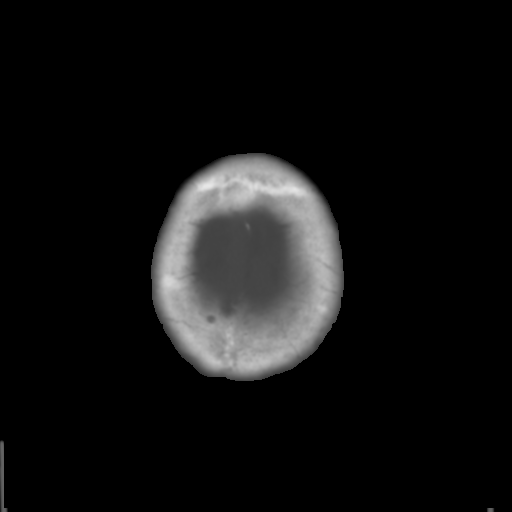

[Series 4: coronal soft tissue · coronal · 0.34mm/px · 3 of 63 slices shown]
[im 21/63  brain]
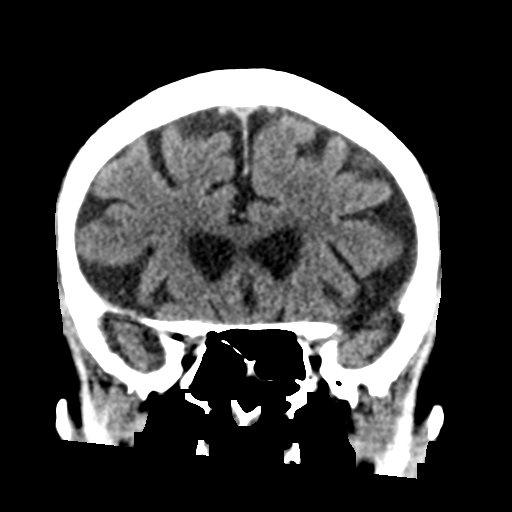
[im 28/63  brain]
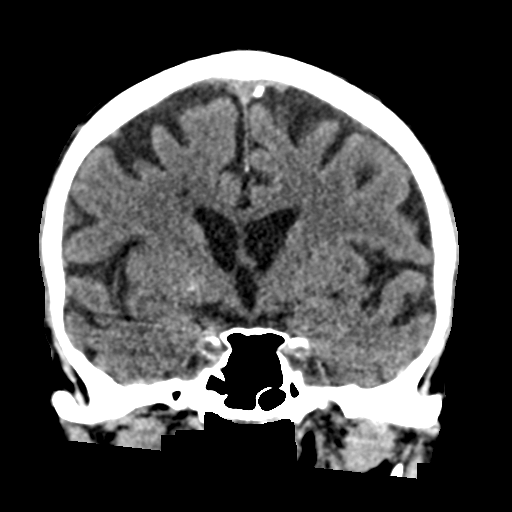
[im 35/63  brain]
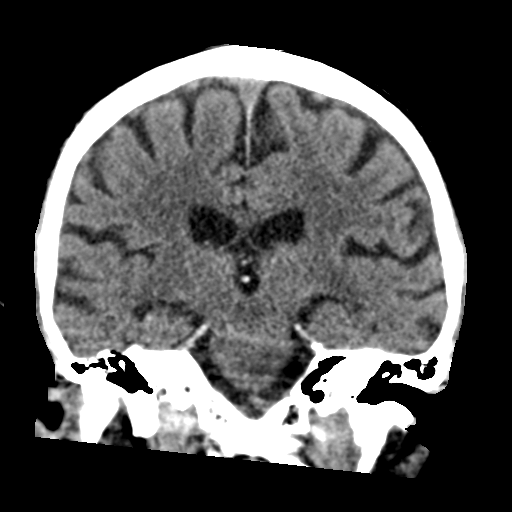

[Series 5: sagittal soft tissue · sagittal · 0.31mm/px · 3 of 50 slices shown]
[im 17/50  brain]
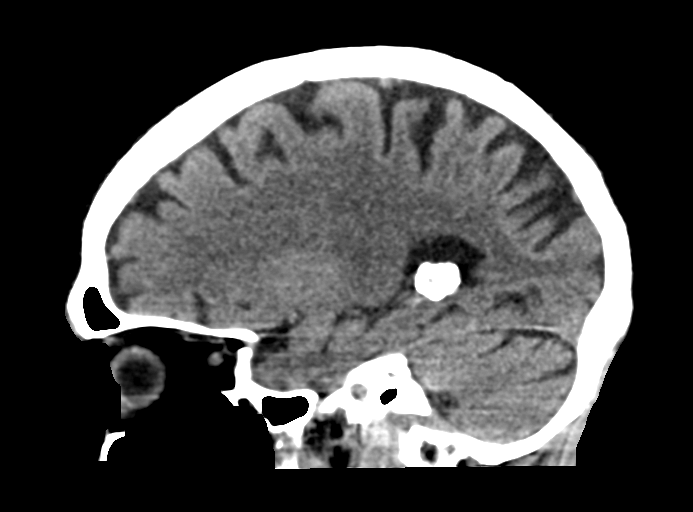
[im 25/50  brain]
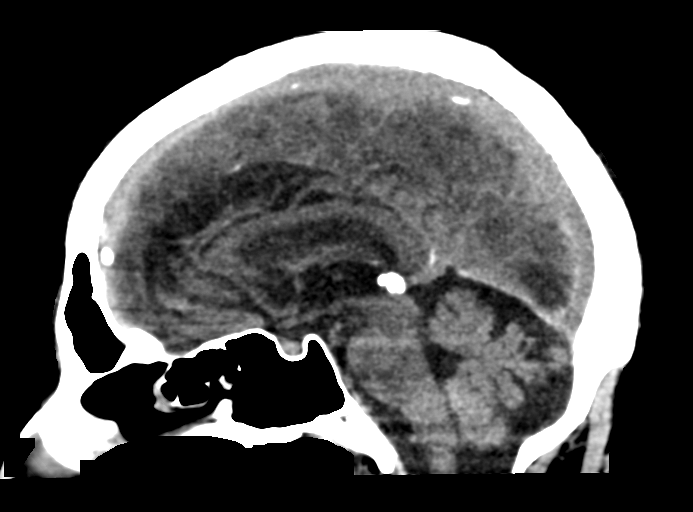
[im 33/50  brain]
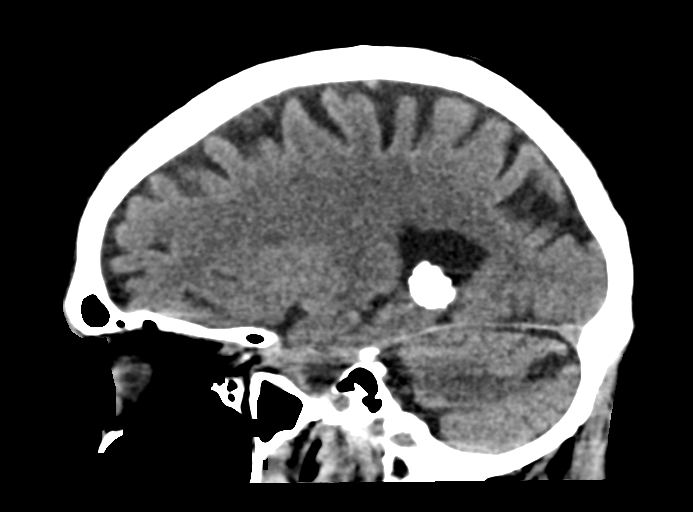

[15 of 45 positions shown; findings below may reference images not displayed]

FINDINGS: Brain: Generalized brain atrophy. Old small vessel cerebellar
infarctions. Chronic small-vessel ischemic changes of the thalami,
basal ganglia and hemispheric white matter. No cortical or large
vessel territory infarction. No mass lesion, hemorrhage,
hydrocephalus or extra-axial collection.

Vascular: There is atherosclerotic calcification of the major
vessels at the base of the brain.

Skull: Normal

Sinuses/Orbits: Clear/normal

Other: Benign choroid calcification and pineal calcification.
IMPRESSION: No acute finding by CT. Generalized atrophy. Chronic small-vessel
ischemic changes affecting the cerebellum an the cerebral
hemispheres as outlined above.
